# Patient Record
Sex: Female | Born: 1960 | Race: White | Hispanic: No | Marital: Married | Smoking: Never smoker
Health system: Southern US, Community
[De-identification: ages and names within clinical notes are randomized; demographics above are authoritative.]

## PROBLEM LIST (undated history)

## (undated) DIAGNOSIS — F419 Anxiety disorder, unspecified: Secondary | ICD-10-CM

## (undated) DIAGNOSIS — R011 Cardiac murmur, unspecified: Secondary | ICD-10-CM

## (undated) DIAGNOSIS — I10 Essential (primary) hypertension: Secondary | ICD-10-CM

## (undated) HISTORY — DX: Essential (primary) hypertension: I10

## (undated) HISTORY — DX: Cardiac murmur, unspecified: R01.1

## (undated) HISTORY — DX: Anxiety disorder, unspecified: F41.9

---

## 1998-05-24 HISTORY — PX: BUNIONECTOMY: SHX129

## 1999-02-10 ENCOUNTER — Other Ambulatory Visit: Admission: RE | Admit: 1999-02-10 | Discharge: 1999-02-10 | Payer: Self-pay | Admitting: Gynecology

## 1999-09-11 ENCOUNTER — Encounter: Payer: Self-pay | Admitting: Gynecology

## 1999-09-11 ENCOUNTER — Encounter: Admission: RE | Admit: 1999-09-11 | Discharge: 1999-09-11 | Payer: Self-pay | Admitting: Gynecology

## 2000-05-31 ENCOUNTER — Other Ambulatory Visit: Admission: RE | Admit: 2000-05-31 | Discharge: 2000-05-31 | Payer: Self-pay | Admitting: Gynecology

## 2000-06-20 ENCOUNTER — Encounter: Payer: Self-pay | Admitting: Gynecology

## 2000-06-20 ENCOUNTER — Encounter: Admission: RE | Admit: 2000-06-20 | Discharge: 2000-06-20 | Payer: Self-pay | Admitting: Gynecology

## 2001-06-14 ENCOUNTER — Other Ambulatory Visit: Admission: RE | Admit: 2001-06-14 | Discharge: 2001-06-14 | Payer: Self-pay | Admitting: Gynecology

## 2001-06-22 ENCOUNTER — Encounter: Payer: Self-pay | Admitting: Gynecology

## 2001-06-22 ENCOUNTER — Encounter: Admission: RE | Admit: 2001-06-22 | Discharge: 2001-06-22 | Payer: Self-pay | Admitting: Gynecology

## 2002-08-01 ENCOUNTER — Other Ambulatory Visit: Admission: RE | Admit: 2002-08-01 | Discharge: 2002-08-01 | Payer: Self-pay | Admitting: Gynecology

## 2002-10-25 ENCOUNTER — Encounter: Admission: RE | Admit: 2002-10-25 | Discharge: 2002-10-25 | Payer: Self-pay | Admitting: Gynecology

## 2002-10-25 ENCOUNTER — Encounter: Payer: Self-pay | Admitting: Gynecology

## 2003-04-12 ENCOUNTER — Encounter: Admission: RE | Admit: 2003-04-12 | Discharge: 2003-04-12 | Payer: Self-pay | Admitting: Gynecology

## 2003-09-12 ENCOUNTER — Other Ambulatory Visit: Admission: RE | Admit: 2003-09-12 | Discharge: 2003-09-12 | Payer: Self-pay | Admitting: Gynecology

## 2004-09-01 ENCOUNTER — Encounter: Admission: RE | Admit: 2004-09-01 | Discharge: 2004-09-01 | Payer: Self-pay | Admitting: Gynecology

## 2004-09-16 ENCOUNTER — Encounter: Admission: RE | Admit: 2004-09-16 | Discharge: 2004-09-16 | Payer: Self-pay | Admitting: Gynecology

## 2004-09-17 ENCOUNTER — Other Ambulatory Visit: Admission: RE | Admit: 2004-09-17 | Discharge: 2004-09-17 | Payer: Self-pay | Admitting: Gynecology

## 2005-09-03 ENCOUNTER — Encounter: Admission: RE | Admit: 2005-09-03 | Discharge: 2005-09-03 | Payer: Self-pay | Admitting: Gynecology

## 2005-09-22 ENCOUNTER — Other Ambulatory Visit: Admission: RE | Admit: 2005-09-22 | Discharge: 2005-09-22 | Payer: Self-pay | Admitting: Gynecology

## 2006-09-06 ENCOUNTER — Encounter: Admission: RE | Admit: 2006-09-06 | Discharge: 2006-09-06 | Payer: Self-pay | Admitting: Gynecology

## 2006-09-13 ENCOUNTER — Encounter: Admission: RE | Admit: 2006-09-13 | Discharge: 2006-09-13 | Payer: Self-pay | Admitting: Gynecology

## 2006-09-27 ENCOUNTER — Other Ambulatory Visit: Admission: RE | Admit: 2006-09-27 | Discharge: 2006-09-27 | Payer: Self-pay | Admitting: Gynecology

## 2007-03-14 ENCOUNTER — Encounter: Admission: RE | Admit: 2007-03-14 | Discharge: 2007-03-14 | Payer: Self-pay | Admitting: Gynecology

## 2008-03-14 ENCOUNTER — Encounter: Admission: RE | Admit: 2008-03-14 | Discharge: 2008-03-14 | Payer: Self-pay | Admitting: Gynecology

## 2008-05-30 ENCOUNTER — Encounter: Admission: RE | Admit: 2008-05-30 | Discharge: 2008-05-30 | Payer: Self-pay | Admitting: Gynecology

## 2009-03-17 ENCOUNTER — Encounter: Admission: RE | Admit: 2009-03-17 | Discharge: 2009-03-17 | Payer: Self-pay | Admitting: Gynecology

## 2009-05-24 HISTORY — PX: COLONOSCOPY: SHX174

## 2009-12-06 LAB — HM COLONOSCOPY

## 2010-03-31 ENCOUNTER — Encounter: Admission: RE | Admit: 2010-03-31 | Discharge: 2010-03-31 | Payer: Self-pay | Admitting: Gynecology

## 2011-03-10 ENCOUNTER — Other Ambulatory Visit: Payer: Self-pay | Admitting: Gynecology

## 2011-03-10 DIAGNOSIS — Z1231 Encounter for screening mammogram for malignant neoplasm of breast: Secondary | ICD-10-CM

## 2011-04-06 ENCOUNTER — Ambulatory Visit
Admission: RE | Admit: 2011-04-06 | Discharge: 2011-04-06 | Disposition: A | Discharge status: Home / Self Care | Payer: BC Managed Care – PPO | Source: Ambulatory Visit | Attending: Gynecology | Admitting: Gynecology

## 2011-04-06 DIAGNOSIS — Z1231 Encounter for screening mammogram for malignant neoplasm of breast: Secondary | ICD-10-CM

## 2011-10-14 ENCOUNTER — Ambulatory Visit (INDEPENDENT_AMBULATORY_CARE_PROVIDER_SITE_OTHER): Payer: No Typology Code available for payment source | Admitting: Family Medicine

## 2011-10-14 ENCOUNTER — Encounter: Payer: Self-pay | Admitting: Family Medicine

## 2011-10-14 DIAGNOSIS — F411 Generalized anxiety disorder: Secondary | ICD-10-CM

## 2011-10-14 DIAGNOSIS — Z0000 Encounter for general adult medical examination without abnormal findings: Secondary | ICD-10-CM | POA: Insufficient documentation

## 2011-10-14 DIAGNOSIS — I10 Essential (primary) hypertension: Secondary | ICD-10-CM

## 2011-10-14 DIAGNOSIS — F419 Anxiety disorder, unspecified: Secondary | ICD-10-CM

## 2011-10-14 LAB — HEPATIC FUNCTION PANEL
ALT: 18 U/L (ref 0–35)
AST: 23 U/L (ref 0–37)
Albumin: 4 g/dL (ref 3.5–5.2)
Bilirubin, Direct: 0.1 mg/dL (ref 0.0–0.3)
Total Bilirubin: 0.5 mg/dL (ref 0.3–1.2)
Total Protein: 7 g/dL (ref 6.0–8.3)

## 2011-10-14 LAB — BASIC METABOLIC PANEL
CO2: 27 mEq/L (ref 19–32)
Chloride: 102 mEq/L (ref 96–112)
Potassium: 3.6 mEq/L (ref 3.5–5.1)

## 2011-10-14 LAB — CBC WITH DIFFERENTIAL/PLATELET
Eosinophils Absolute: 0.1 10*3/uL (ref 0.0–0.7)
HCT: 45 % (ref 36.0–46.0)
Hemoglobin: 15 g/dL (ref 12.0–15.0)
Lymphs Abs: 2.1 10*3/uL (ref 0.7–4.0)
MCHC: 33.3 g/dL (ref 30.0–36.0)
MCV: 96.3 fl (ref 78.0–100.0)
Neutro Abs: 2.9 10*3/uL (ref 1.4–7.7)
RDW: 13.3 % (ref 11.5–14.6)
WBC: 5.6 10*3/uL (ref 4.5–10.5)

## 2011-10-14 LAB — LIPID PANEL
LDL Cholesterol: 105 mg/dL — ABNORMAL HIGH (ref 0–99)
Total CHOL/HDL Ratio: 3

## 2011-10-14 NOTE — Patient Instructions (Signed)
We'll notify you of your lab results Continue your current meds Call with any questions or concerns Keep up the good work!  You look great! Think of [place] as your home [place] Welcome!  We're glad to have you!!!

## 2011-10-14 NOTE — Progress Notes (Signed)
  Subjective:    Patient ID: Mariah Moss, female    DOB: 07/16/1960, 51 y.o.   MRN: [id number]  HPI New to establish.  No previous PCP.  GYN- Dr Nicholas Lose, last physical 8/12, thinks she had labs done.  R middle finger trigger finger- noticed 3 months ago.  HTN- chronic problem, well controlled on Maxzide.  Anxiety- chronic problem, on Celexa x3 yrs w/ good sxs control.   Review of Systems Patient reports no vision/ hearing changes, adenopathy,fever, weight change,  persistant/recurrent hoarseness , swallowing issues, chest pain, palpitations, edema, persistant/recurrent cough, hemoptysis, dyspnea (rest/exertional/paroxysmal nocturnal), gastrointestinal bleeding (melena, rectal bleeding), abdominal pain, significant heartburn, bowel changes, GU symptoms (dysuria, hematuria, incontinence), Gyn symptoms (abnormal  bleeding, pain),  syncope, focal weakness, memory loss, numbness & tingling, skin/hair/nail changes, abnormal bruising or bleeding, anxiety, or depression.     Objective:   Physical Exam General Appearance:    Alert, cooperative, no distress, appears stated age  Head:    Normocephalic, without obvious abnormality, atraumatic  Eyes:    PERRL, conjunctiva/corneas clear, EOM's intact, fundi    benign, both eyes  Ears:    Normal TM's and external ear canals, both ears  Nose:   Nares normal, septum midline, mucosa normal, no drainage    or sinus tenderness  Throat:   Lips, mucosa, and tongue normal; teeth and gums normal  Neck:   Supple, symmetrical, trachea midline, no adenopathy;    Thyroid: no enlargement/tenderness/nodules  Back:     Symmetric, no curvature, ROM normal, no CVA tenderness  Lungs:     Clear to auscultation bilaterally, respirations unlabored  Chest Wall:    No tenderness or deformity   Heart:    Regular rate and rhythm, S1 and S2 normal, no murmur, rub   or gallop  Breast Exam:    Deferred to GYN  Abdomen:     Soft, non-tender, bowel sounds active all four  quadrants,    no masses, no organomegaly  Genitalia:    Deferred to GYN  Rectal:    Extremities:   Extremities normal, atraumatic, no cyanosis or edema  Pulses:   2+ and symmetric all extremities  Skin:   Skin color, texture, turgor normal, no rashes or lesions  Lymph nodes:   Cervical, supraclavicular, and axillary nodes normal  Neurologic:   CNII-XII intact, normal strength, sensation and reflexes    throughout          Assessment & Plan:

## 2011-10-17 LAB — VITAMIN D 1,25 DIHYDROXY
Vitamin D 1, 25 (OH)2 Total: 45 pg/mL (ref 18–72)
Vitamin D2 1, 25 (OH)2: <8 pg/mL
Vitamin D3 1, 25 (OH)2: 45 pg/mL

## 2011-10-19 ENCOUNTER — Encounter: Payer: Self-pay | Admitting: Family Medicine

## 2011-10-19 NOTE — Assessment & Plan Note (Signed)
New to provider- chronic for pt.  Well controlled on current meds.  Asymptomatic.  No changes.  Will assume prescribing role.

## 2011-10-19 NOTE — Assessment & Plan Note (Signed)
Pt's PE WNL.  UTD on GYN and colonoscopy.  Check labs.  EKG done- see document for interpretation.  Anticipatory guidance provided.  

## 2011-10-19 NOTE — Assessment & Plan Note (Signed)
New to provider.  Chronic for pt.  Well controlled on current SSRI.  No changes.  Will follow.

## 2011-10-20 ENCOUNTER — Telehealth: Payer: Self-pay | Admitting: *Deleted

## 2011-10-20 NOTE — Telephone Encounter (Signed)
Patient called and left voice message requesting a return phone call regarding her lab results.

## 2011-10-21 NOTE — Telephone Encounter (Signed)
Noted pt discussed her lab results on 10-19-11 with F. Deloach, called pt to advise, left message on home number to call office if further questions

## 2011-10-29 ENCOUNTER — Encounter: Payer: Self-pay | Admitting: Family Medicine

## 2011-10-29 ENCOUNTER — Ambulatory Visit (INDEPENDENT_AMBULATORY_CARE_PROVIDER_SITE_OTHER): Payer: No Typology Code available for payment source | Admitting: Family Medicine

## 2011-10-29 ENCOUNTER — Telehealth: Payer: Self-pay | Admitting: Family Medicine

## 2011-10-29 VITALS — BP 124/78 | HR 65 | Temp 98.3°F | Ht 65.0 in | Wt 140.6 lb

## 2011-10-29 DIAGNOSIS — L237 Allergic contact dermatitis due to plants, except food: Secondary | ICD-10-CM | POA: Insufficient documentation

## 2011-10-29 DIAGNOSIS — L255 Unspecified contact dermatitis due to plants, except food: Secondary | ICD-10-CM

## 2011-10-29 LAB — BASIC METABOLIC PANEL
BUN: 20 mg/dL (ref 6–23)
Calcium: 10.6 mg/dL — ABNORMAL HIGH (ref 8.4–10.5)
Sodium: 140 mEq/L (ref 135–145)

## 2011-10-29 MED ORDER — METHYLPREDNISOLONE ACETATE 80 MG/ML IJ SUSP
80.0000 mg | Freq: Once | INTRAMUSCULAR | Status: AC
  Administered 2011-10-29: 80 mg via INTRAMUSCULAR

## 2011-10-29 NOTE — Patient Instructions (Addendum)
The depo medrol should treat your poison- if after 3 days your symptoms continue to worsen, call and we'll send the oral steroids Use hydrocortisone cream as needed We'll notify you of your lab results Call with any questions or concerns Have a great weekend!!

## 2011-10-29 NOTE — Telephone Encounter (Signed)
Treated by MD Beverely Low in office today

## 2011-10-29 NOTE — Telephone Encounter (Signed)
Will see at appt

## 2011-10-29 NOTE — Telephone Encounter (Signed)
Caller: Mariah Moss/Patient; PCP: Sheliah Hatch.; CB#: [phone]; ; ; Call regarding Poison [place] [facility];  occurs most often in the spring; in the past, has been able to get a shot of prednisone at [facility] and that works well.  Is a newer patient of Dr. Rennis Golden and would like a prednisone shot.  Onset 10/25/11 and is unresponsive to OTC medications.  Per protocol, emergent symptoms denied; advised appt within 24 hours.  Appt sched 10/29/11 1315 with Dr. Beverely Low.

## 2011-10-29 NOTE — Progress Notes (Signed)
  Subjective:    Patient ID: Mariah Moss, female    DOB: 03-06-1961, 51 y.o.   MRN: [id number]  HPI Poison ivy- pt reports she gets this every spring.  Has been using OTC meds w/out relief.  Has hx of similar, typically requiring steroid shot.  Usually does not require oral meds- 'i just get a shot'.  Has patches on all extremities and trunk.  Very itchy  Hypercalcemia- slightly elevated on recent labs.  Due for recheck.  Denies abd pain, arthralgias, fatigue.   Review of Systems For ROS see HPI     Objective:   Physical Exam  Vitals reviewed. Constitutional: She appears well-developed and well-nourished. No distress.  Skin: Skin is warm and dry. Rash (patchy, linear rash consistent w/ contact dermatitis) noted.          Assessment & Plan:

## 2011-10-31 NOTE — Assessment & Plan Note (Signed)
New.  Discovered on recent labs.  Asymptomatic.  Recheck today to trend.

## 2011-10-31 NOTE — Assessment & Plan Note (Signed)
New.  Pt w/ hx of similar.  Although it is unconventional, pt reports that she only requires shot and does not take oral steroids.  Depo-medrol given.  Pt to call if sxs don't improve, will then prescribe oral meds.  Pt expressed understanding and is in agreement w/ plan.

## 2011-11-01 ENCOUNTER — Other Ambulatory Visit: Payer: No Typology Code available for payment source

## 2011-11-04 ENCOUNTER — Encounter: Payer: Self-pay | Admitting: *Deleted

## 2012-02-07 ENCOUNTER — Other Ambulatory Visit: Payer: Self-pay | Admitting: Family Medicine

## 2012-02-07 NOTE — Telephone Encounter (Signed)
Ok for #90 of each 

## 2012-02-07 NOTE — Telephone Encounter (Signed)
Lm on triage line at 114pm please call [phone] if no answer call [phone]

## 2012-02-07 NOTE — Telephone Encounter (Signed)
needs to speak with someone regarding refills needed - LM on triage line at 919am Cb# [phone]

## 2012-02-07 NOTE — Telephone Encounter (Signed)
Left another msg for pt to call

## 2012-02-07 NOTE — Telephone Encounter (Signed)
Pt left vm on triage line stating she was returning someone's call. Call back # [phone]

## 2012-02-07 NOTE — Telephone Encounter (Signed)
Pt says Dr. Beverely Low is taking over her prescriptions that were previously prescribed by her ob/gyn.  She is requesting a 90 day supply for her citalopram 20mg  once daily and maxide 37.5/25mg  once daily.  [facility] [address]

## 2012-02-08 MED ORDER — TRIAMTERENE-HCTZ 37.5-25 MG PO TABS: 1.0000 | ORAL_TABLET | Freq: Every day | ORAL | Status: DC

## 2012-02-08 MED ORDER — CITALOPRAM HYDROBROMIDE 20 MG PO TABS: 20.0000 mg | ORAL_TABLET | Freq: Every day | ORAL | Status: DC

## 2012-02-08 NOTE — Telephone Encounter (Signed)
rx sent to pharmacy by e-script Noted waiver in pt chart signed to allow detailed messages to be left on voicemail, left detailed message about: results/instructions/prescribtion information. Advise if any further concerns or questions please call our office at [phone].

## 2012-03-03 ENCOUNTER — Other Ambulatory Visit: Payer: Self-pay | Admitting: Family Medicine

## 2012-03-03 NOTE — Telephone Encounter (Signed)
Pt advised that she did not receive a 90 day supply from the pharmacy and wanted to have a year supply for the citalopram and was upset that she did not get this to begin with, advised MD Beverely Low is not in the office at this time and I will address this with the pharmacy as well, spoke to [facility] pharmacist [facility] advising they had a glitch in their system and must have not gotten the RX, gave verbal order to give pt 90 day supply of Celexa 20mg  and Maxide 90 day supply, left vm on pt home and work number to call office to advise that I will also send a notation to MD Tabori concerning the request of a years supply, please advise new to establish with CPE noted on 5-13

## 2012-03-03 NOTE — Telephone Encounter (Signed)
Pt has questions about meds - one question she mentioned was rx that has only been given for one month and not for a year. She also has other questions, pls call pt

## 2012-03-03 NOTE — Telephone Encounter (Signed)
.  left message to have patient return my call per noted the medications were sent for 02-08-12 for a 3 month supply per MD Tabori,NOT a one month supply

## 2012-03-03 NOTE — Telephone Encounter (Signed)
Noted waiver in pt chart signed to allow detailed messages to be left on voicemail, left detailed message about: results/instructions/prescribtion information. Advise if any further concerns or questions please call our office at [phone]. To advise that [facility] day supply has been sent for celexa and maxide, that MD Beverely Low can verify if a year supply can be sent then we will call her back with MD Tabori response

## 2012-03-07 MED ORDER — TRIAMTERENE-HCTZ 37.5-25 MG PO TABS: 1.0000 | ORAL_TABLET | Freq: Every day | ORAL | Status: DC

## 2012-03-07 MED ORDER — CITALOPRAM HYDROBROMIDE 20 MG PO TABS: 20.0000 mg | ORAL_TABLET | Freq: Every day | ORAL | Status: DC

## 2012-03-07 NOTE — Telephone Encounter (Signed)
Spoke with MD Beverely Low concerning the refill for all of pt medications to be sent for a year, MD gave verbal ok to send pt a years worth of medications, sent celexa and maxide for #90 with 1 refill to cover pt for the year until cpe for 5-14, left detailed message per dpr to call office if any further assistance needed per sent refills for the remainder of the year via escribe per pt request

## 2012-03-20 ENCOUNTER — Other Ambulatory Visit: Payer: Self-pay | Admitting: Gynecology

## 2012-03-20 DIAGNOSIS — Z1231 Encounter for screening mammogram for malignant neoplasm of breast: Secondary | ICD-10-CM

## 2012-04-03 ENCOUNTER — Encounter: Payer: Self-pay | Admitting: Family Medicine

## 2012-04-10 ENCOUNTER — Ambulatory Visit
Admission: RE | Admit: 2012-04-10 | Discharge: 2012-04-10 | Disposition: A | Discharge status: Home / Self Care | Payer: No Typology Code available for payment source | Source: Ambulatory Visit | Attending: Gynecology | Admitting: Gynecology

## 2012-04-10 DIAGNOSIS — Z1231 Encounter for screening mammogram for malignant neoplasm of breast: Secondary | ICD-10-CM

## 2012-07-08 ENCOUNTER — Other Ambulatory Visit: Payer: Self-pay

## 2012-07-22 ENCOUNTER — Other Ambulatory Visit: Payer: Self-pay | Admitting: Family Medicine

## 2012-07-24 NOTE — Telephone Encounter (Signed)
Please advise on RF request.  Last CPE;10-14-11.//AB/CMA

## 2012-09-21 ENCOUNTER — Ambulatory Visit (INDEPENDENT_AMBULATORY_CARE_PROVIDER_SITE_OTHER): Payer: No Typology Code available for payment source | Admitting: Family Medicine

## 2012-09-21 ENCOUNTER — Encounter: Payer: Self-pay | Admitting: Family Medicine

## 2012-09-21 VITALS — BP 110/80 | HR 61 | Temp 98.4°F | Ht 64.25 in | Wt 145.4 lb

## 2012-09-21 DIAGNOSIS — Z0000 Encounter for general adult medical examination without abnormal findings: Secondary | ICD-10-CM

## 2012-09-21 DIAGNOSIS — Z1331 Encounter for screening for depression: Secondary | ICD-10-CM

## 2012-09-21 MED ORDER — TRIAMTERENE-HCTZ 37.5-25 MG PO TABS: ORAL_TABLET | ORAL | Status: DC

## 2012-09-21 MED ORDER — CITALOPRAM HYDROBROMIDE 20 MG PO TABS: ORAL_TABLET | ORAL | Status: DC

## 2012-09-21 NOTE — Progress Notes (Signed)
  Subjective:    Patient ID: Mariah Moss, female    DOB: 1960/10/26, 52 y.o.   MRN: [id number]  HPI CPE- UTD on GYN, colonoscopy.  No concerns.   Review of Systems Patient reports no vision/ hearing changes, adenopathy,fever, weight change,  persistant/recurrent hoarseness , swallowing issues, chest pain, palpitations, edema, persistant/recurrent cough, hemoptysis, dyspnea (rest/exertional/paroxysmal nocturnal), gastrointestinal bleeding (melena, rectal bleeding), abdominal pain, significant heartburn, bowel changes, GU symptoms (dysuria, hematuria, incontinence), Gyn symptoms (abnormal  bleeding, pain),  syncope, focal weakness, memory loss, numbness & tingling, skin/hair/nail changes, abnormal bruising or bleeding, anxiety, or depression.     Objective:   Physical Exam General Appearance:    Alert, cooperative, no distress, appears stated age  Head:    Normocephalic, without obvious abnormality, atraumatic  Eyes:    PERRL, conjunctiva/corneas clear, EOM's intact, fundi    benign, both eyes  Ears:    Normal TM's and external ear canals, both ears  Nose:   Nares normal, septum midline, mucosa normal, no drainage    or sinus tenderness  Throat:   Lips, mucosa, and tongue normal; teeth and gums normal  Neck:   Supple, symmetrical, trachea midline, no adenopathy;    Thyroid: no enlargement/tenderness/nodules  Back:     Symmetric, no curvature, ROM normal, no CVA tenderness  Lungs:     Clear to auscultation bilaterally, respirations unlabored  Chest Wall:    No tenderness or deformity   Heart:    Regular rate and rhythm, S1 and S2 normal, no murmur, rub   or gallop  Breast Exam:    Deferred to GYN  Abdomen:     Soft, non-tender, bowel sounds active all four quadrants,    no masses, no organomegaly  Genitalia:    Deferred to GYN  Rectal:    Extremities:   Extremities normal, atraumatic, no cyanosis or edema  Pulses:   2+ and symmetric all extremities  Skin:   Skin color, texture,  turgor normal, no rashes or lesions  Lymph nodes:   Cervical, supraclavicular, and axillary nodes normal  Neurologic:   CNII-XII intact, normal strength, sensation and reflexes    throughout          Assessment & Plan:

## 2012-09-21 NOTE — Patient Instructions (Addendum)
Follow up in 1 year or as needed We'll notify you of your lab results and make any changes if needed Keep up the good work!  You look great! Call with any questions or concerns Happy Spring!!! 

## 2012-09-22 LAB — CBC WITH DIFFERENTIAL/PLATELET
Basophils Absolute: 0.1 10*3/uL (ref 0.0–0.1)
Eosinophils Absolute: 0.1 10*3/uL (ref 0.0–0.7)
HCT: 43.2 % (ref 36.0–46.0)
Hemoglobin: 15.1 g/dL — ABNORMAL HIGH (ref 12.0–15.0)
Lymphs Abs: 2.3 10*3/uL (ref 0.7–4.0)
MCV: 93.3 fl (ref 78.0–100.0)
Monocytes Relative: 7.2 % (ref 3.0–12.0)
RDW: 13.7 % (ref 11.5–14.6)
WBC: 6.6 10*3/uL (ref 4.5–10.5)

## 2012-09-24 NOTE — Assessment & Plan Note (Signed)
Pt's PE WNL.  UTD on GYN, colonoscopy.  Check labs.  Anticipatory guidance provided.  

## 2012-09-25 LAB — LIPID PANEL
HDL: 62.9 mg/dL (ref >39.00)
Total CHOL/HDL Ratio: 3
VLDL: 15.6 mg/dL (ref 0.0–40.0)

## 2012-09-25 LAB — BASIC METABOLIC PANEL
CO2: 28 mEq/L (ref 19–32)
Potassium: 3.6 mEq/L (ref 3.5–5.1)

## 2012-09-25 LAB — VITAMIN D 1,25 DIHYDROXY
Vitamin D 1, 25 (OH)2 Total: 53 pg/mL (ref 18–72)
Vitamin D2 1, 25 (OH)2: <8 pg/mL
Vitamin D3 1, 25 (OH)2: 53 pg/mL

## 2012-09-25 LAB — HEPATIC FUNCTION PANEL
ALT: 27 U/L (ref 0–35)
AST: 37 U/L (ref 0–37)
Alkaline Phosphatase: 68 U/L (ref 39–117)
Total Bilirubin: 0.7 mg/dL (ref 0.3–1.2)

## 2012-12-06 LAB — HM PAP SMEAR: HM Pap smear: NORMAL

## 2013-03-29 ENCOUNTER — Other Ambulatory Visit: Payer: Self-pay

## 2013-04-25 ENCOUNTER — Other Ambulatory Visit: Payer: Self-pay

## 2013-04-25 DIAGNOSIS — Z1231 Encounter for screening mammogram for malignant neoplasm of breast: Secondary | ICD-10-CM

## 2013-05-24 LAB — HM MAMMOGRAPHY: HM Mammogram: NORMAL

## 2013-05-31 ENCOUNTER — Ambulatory Visit
Admission: RE | Admit: 2013-05-31 | Discharge: 2013-05-31 | Disposition: A | Discharge status: Home / Self Care | Payer: BC Managed Care – PPO | Source: Ambulatory Visit

## 2013-05-31 DIAGNOSIS — Z1231 Encounter for screening mammogram for malignant neoplasm of breast: Secondary | ICD-10-CM

## 2013-06-05 ENCOUNTER — Other Ambulatory Visit: Payer: Self-pay | Admitting: Gynecology

## 2013-06-05 DIAGNOSIS — R928 Other abnormal and inconclusive findings on diagnostic imaging of breast: Secondary | ICD-10-CM

## 2013-06-15 ENCOUNTER — Ambulatory Visit
Admission: RE | Admit: 2013-06-15 | Discharge: 2013-06-15 | Disposition: A | Discharge status: Home / Self Care | Payer: BC Managed Care – PPO | Source: Ambulatory Visit | Attending: Gynecology | Admitting: Gynecology

## 2013-06-15 DIAGNOSIS — R928 Other abnormal and inconclusive findings on diagnostic imaging of breast: Secondary | ICD-10-CM

## 2013-10-15 ENCOUNTER — Other Ambulatory Visit: Payer: Self-pay | Admitting: Family Medicine

## 2013-10-17 NOTE — Telephone Encounter (Signed)
Med filled and letter mailed to pt to make follow up BP and annual exam.

## 2013-12-04 ENCOUNTER — Other Ambulatory Visit: Payer: Self-pay | Admitting: Family Medicine

## 2013-12-05 NOTE — Telephone Encounter (Signed)
Med filled, pt has cpe tomorrow.

## 2013-12-05 NOTE — Telephone Encounter (Signed)
Pt is needing the refill.  Pt lost medications and is just needing these refilled.

## 2013-12-06 ENCOUNTER — Ambulatory Visit (INDEPENDENT_AMBULATORY_CARE_PROVIDER_SITE_OTHER): Payer: BC Managed Care – PPO | Admitting: Family Medicine

## 2013-12-06 ENCOUNTER — Encounter: Payer: Self-pay | Admitting: Family Medicine

## 2013-12-06 VITALS — BP 120/78 | HR 59 | Temp 98.0°F | Resp 16 | Ht 65.0 in | Wt 137.0 lb

## 2013-12-06 DIAGNOSIS — Z0000 Encounter for general adult medical examination without abnormal findings: Secondary | ICD-10-CM

## 2013-12-06 DIAGNOSIS — M461 Sacroiliitis, not elsewhere classified: Secondary | ICD-10-CM

## 2013-12-06 LAB — CBC WITH DIFFERENTIAL/PLATELET
BASOS PCT: 0.3 % (ref 0.0–3.0)
Basophils Absolute: 0 10*3/uL (ref 0.0–0.1)
EOS PCT: 0.8 % (ref 0.0–5.0)
Eosinophils Absolute: 0 10*3/uL (ref 0.0–0.7)
HEMATOCRIT: 44.6 % (ref 36.0–46.0)
HEMOGLOBIN: 15.1 g/dL — AB (ref 12.0–15.0)
LYMPHS ABS: 1.6 10*3/uL (ref 0.7–4.0)
Lymphocytes Relative: 29.2 % (ref 12.0–46.0)
MCHC: 33.8 g/dL (ref 30.0–36.0)
MCV: 95 fl (ref 78.0–100.0)
Monocytes Absolute: 0.4 10*3/uL (ref 0.1–1.0)
Monocytes Relative: 8.2 % (ref 3.0–12.0)
NEUTROS ABS: 3.3 10*3/uL (ref 1.4–7.7)
Neutrophils Relative %: 61.5 % (ref 43.0–77.0)
Platelets: 203 10*3/uL (ref 150.0–400.0)
RBC: 4.69 Mil/uL (ref 3.87–5.11)
RDW: 13.9 % (ref 11.5–15.5)
WBC: 5.4 10*3/uL (ref 4.0–10.5)

## 2013-12-06 LAB — LIPID PANEL
CHOL/HDL RATIO: 3
Cholesterol: 203 mg/dL — ABNORMAL HIGH (ref 0–200)
HDL: 71.3 mg/dL (ref >39.00)
LDL CALC: 124 mg/dL — AB (ref 0–99)
NONHDL: 131.7
TRIGLYCERIDES: 41 mg/dL (ref 0.0–149.0)
VLDL: 8.2 mg/dL (ref 0.0–40.0)

## 2013-12-06 LAB — HEPATIC FUNCTION PANEL
ALT: 20 U/L (ref 0–35)
AST: 19 U/L (ref 0–37)
Albumin: 4 g/dL (ref 3.5–5.2)
Alkaline Phosphatase: 74 U/L (ref 39–117)
BILIRUBIN TOTAL: 0.6 mg/dL (ref 0.2–1.2)
Bilirubin, Direct: 0 mg/dL (ref 0.0–0.3)
Total Protein: 7 g/dL (ref 6.0–8.3)

## 2013-12-06 LAB — BASIC METABOLIC PANEL
BUN: 22 mg/dL (ref 6–23)
CO2: 27 mEq/L (ref 19–32)
CREATININE: 1 mg/dL (ref 0.4–1.2)
Calcium: 10.9 mg/dL — ABNORMAL HIGH (ref 8.4–10.5)
Chloride: 107 mEq/L (ref 96–112)
GFR: 64.37 mL/min (ref >60.00)
Glucose, Bld: 92 mg/dL (ref 70–99)
POTASSIUM: 3.9 meq/L (ref 3.5–5.1)
Sodium: 139 mEq/L (ref 135–145)

## 2013-12-06 LAB — TSH: TSH: 1.67 u[IU]/mL (ref 0.35–4.50)

## 2013-12-06 LAB — VITAMIN D 25 HYDROXY (VIT D DEFICIENCY, FRACTURES): VITD: 30 ng/mL

## 2013-12-06 MED ORDER — TRIAMTERENE-HCTZ 37.5-25 MG PO TABS: ORAL_TABLET | ORAL | Status: DC

## 2013-12-06 MED ORDER — CYCLOBENZAPRINE HCL 10 MG PO TABS: 10.0000 mg | ORAL_TABLET | Freq: Three times a day (TID) | ORAL | Status: DC | PRN

## 2013-12-06 MED ORDER — CITALOPRAM HYDROBROMIDE 20 MG PO TABS: ORAL_TABLET | ORAL | Status: DC

## 2013-12-06 NOTE — Assessment & Plan Note (Signed)
New.  Pt able to move very fluidly around the room, no difficulty w/ stepping up on table.  + TTP over L SI joint.  Start NSAIDs prn.  Muscle relaxers prn.  Reviewed supportive care and red flags that should prompt return.  Pt expressed understanding and is in agreement w/ plan.

## 2013-12-06 NOTE — Progress Notes (Signed)
Pre visit review using our clinic review tool, if applicable. No additional management support is needed unless otherwise documented below in the visit note. 

## 2013-12-06 NOTE — Patient Instructions (Signed)
Follow up in 1 year- sooner if needed Start the flexeril as needed at night for pain and spasm (can cause drowsiness) Ibuprofen as needed for pain/inflammation Heat as needed Try to remain as active as possible We'll notify you of your lab results and make any changes if needed Call with any questions or concerns Happy [birth year][employer]!

## 2013-12-06 NOTE — Progress Notes (Signed)
   Subjective:    Patient ID: Mariah Moss, female    DOB: 1961/01/02, 53 y.o.   MRN: [id number]  HPI CPE- UTD on colonoscopy, GYN, mammo.  LBP- sxs started 6-9 months ago.  L sided.  Pain is worse w/ prolonged standing or with changing positions.  Pain is worsening rather than improving.  Is more frequent.  No weakness or numbness associated w/ pain.  Relief w/ NSAIDs.  No bowel or bladder incontinence.   Review of Systems Patient reports no vision/ hearing changes, adenopathy,fever, weight change,  persistant/recurrent hoarseness , swallowing issues, chest pain, palpitations, edema, persistant/recurrent cough, hemoptysis, dyspnea (rest/exertional/paroxysmal nocturnal), gastrointestinal bleeding (melena, rectal bleeding), abdominal pain, significant heartburn, bowel changes, GU symptoms (dysuria, hematuria, incontinence), Gyn symptoms (abnormal  bleeding, pain),  syncope, focal weakness, memory loss, numbness & tingling, skin/hair/nail changes, abnormal bruising or bleeding, anxiety, or depression.     Objective:   Physical Exam General Appearance:    Alert, cooperative, no distress, appears stated age  Head:    Normocephalic, without obvious abnormality, atraumatic  Eyes:    PERRL, conjunctiva/corneas clear, EOM's intact, fundi    benign, both eyes  Ears:    Normal TM's and external ear canals, both ears  Nose:   Nares normal, septum midline, mucosa normal, no drainage    or sinus tenderness  Throat:   Lips, mucosa, and tongue normal; teeth and gums normal  Neck:   Supple, symmetrical, trachea midline, no adenopathy;    Thyroid: no enlargement/tenderness/nodules  Back:     Symmetric, no curvature, ROM normal, no CVA tenderness  Lungs:     Clear to auscultation bilaterally, respirations unlabored  Chest Wall:    No tenderness or deformity   Heart:    Regular rate and rhythm, S1 and S2 normal, no murmur, rub   or gallop  Breast Exam:    Deferred to GYN  Abdomen:     Soft, non-tender,  bowel sounds active all four quadrants,    no masses, no organomegaly  Genitalia:    Deferred to GYN  Rectal:    Extremities:   Extremities normal, atraumatic, no cyanosis or edema  Pulses:   2+ and symmetric all extremities  Skin:   Skin color, texture, turgor normal, no rashes or lesions  Lymph nodes:   Cervical, supraclavicular, and axillary nodes normal  Neurologic:   CNII-XII intact, normal strength, sensation and reflexes    Throughout.  (-) SLR bilaterally Good flexion/extension of spine.  Mild TTP over L SI joint          Assessment & Plan:

## 2013-12-06 NOTE — Assessment & Plan Note (Signed)
Pt's PE WNL.  UTD on GYN and colonoscopy.  Check labs.  Anticipatory guidance provided.  

## 2013-12-07 ENCOUNTER — Ambulatory Visit: Payer: BC Managed Care – PPO

## 2013-12-10 ENCOUNTER — Telehealth: Payer: Self-pay | Admitting: General Practice

## 2013-12-10 NOTE — Telephone Encounter (Signed)
Lorain ChildesFYI, [place] [facility] called stating that [facility] advised them that there was not enough sample to complete PTH lab add on, please advise.

## 2013-12-10 NOTE — Telephone Encounter (Signed)
Please call pt had have her return for lab draw (PTH) due to elevated Ca

## 2013-12-11 NOTE — Telephone Encounter (Signed)
Called pt and lmovm informing pt that she needed to contact the office to schedule PTh lab draw due to not enough sample being left to test. Orders placed.

## 2013-12-19 ENCOUNTER — Encounter: Payer: Self-pay | Admitting: General Practice

## 2013-12-19 NOTE — Telephone Encounter (Signed)
Cannot reach pt, letter mailed.

## 2014-01-22 ENCOUNTER — Encounter: Payer: Self-pay | Admitting: Medical

## 2014-01-22 ENCOUNTER — Ambulatory Visit (INDEPENDENT_AMBULATORY_CARE_PROVIDER_SITE_OTHER): Payer: BC Managed Care – PPO | Admitting: Medical

## 2014-01-22 VITALS — BP 138/85 | HR 65 | Temp 98.7°F | Ht 64.0 in | Wt 140.8 lb

## 2014-01-22 DIAGNOSIS — H8110 Benign paroxysmal vertigo, unspecified ear: Secondary | ICD-10-CM | POA: Insufficient documentation

## 2014-01-22 MED ORDER — MECLIZINE HCL 12.5 MG PO TABS: 12.5000 mg | ORAL_TABLET | Freq: Three times a day (TID) | ORAL | Status: DC | PRN

## 2014-01-22 MED ORDER — CEFDINIR 300 MG PO CAPS: 300.0000 mg | ORAL_CAPSULE | Freq: Two times a day (BID) | ORAL | Status: DC

## 2014-01-22 NOTE — Patient Instructions (Signed)
I believe you most likely have benign positional vertigo/dizziness. I am prescribing meclizine and will send that to your pharmacy. There are other potential causes that could impact your sense of balance. Your blood pressure is slightly high compared to recent baseline. Therefore I would like you to check your blood pressure daily and notify me if your systolic is close to 140 or above. I also would like you to watch your diastolic blood pressure and notify us if above 90. In that event we could add an additional blood pressure medicine to your current regimen. Also I had notified you of the slight minimal redness on her left tympanic membrane. If you have any your discomfort at all I would like to start an antibiotic which I am making available today. That will be sent to your pharmacy as well. In the event of any severe worsening dizziness with any associated neurologic signs or symptoms as than I would advise emergency department evaluation immediately. If slight dizziness is persisting despite above measures then could consider physical therapy evaluation. Followup in 7 days or as needed. Vertigo Vertigo means you feel like you or your surroundings are moving when they are not. Vertigo can be dangerous if it occurs when you are at work, driving, or performing difficult activities.  CAUSES  Vertigo occurs when there is a conflict of signals sent to your brain from the visual and sensory systems in your body. There are many different causes of vertigo, including:  Infections, especially in the inner ear.  A bad reaction to a drug or misuse of alcohol and medicines.  Withdrawal from drugs or alcohol.  Rapidly changing positions, such as lying down or rolling over in bed.  A migraine headache.  Decreased blood flow to the brain.  Increased pressure in the brain from a head injury, infection, tumor, or bleeding. SYMPTOMS  You may feel as though the world is spinning around or you are falling to  the ground. Because your balance is upset, vertigo can cause nausea and vomiting. You may have involuntary eye movements (nystagmus). DIAGNOSIS  Vertigo is usually diagnosed by physical exam. If the cause of your vertigo is unknown, your caregiver may perform imaging tests, such as an MRI scan (magnetic resonance imaging). TREATMENT  Most cases of vertigo resolve on their own, without treatment. Depending on the cause, your caregiver may prescribe certain medicines. If your vertigo is related to body position issues, your caregiver may recommend movements or procedures to correct the problem. In rare cases, if your vertigo is caused by certain inner ear problems, you may need surgery. HOME CARE INSTRUCTIONS   Follow your caregiver's instructions.  Avoid driving.  Avoid operating heavy machinery.  Avoid performing any tasks that would be dangerous to you or others during a vertigo episode.  Tell your caregiver if you notice that certain medicines seem to be causing your vertigo. Some of the medicines used to treat vertigo episodes can actually make them worse in some people. SEEK IMMEDIATE MEDICAL CARE IF:   Your medicines do not relieve your vertigo or are making it worse.  You develop problems with talking, walking, weakness, or using your arms, hands, or legs.  You develop severe headaches.  Your nausea or vomiting continues or gets worse.  You develop visual changes.  A family member notices behavioral changes.  Your condition gets worse. MAKE SURE YOU:  Understand these instructions.  Will watch your condition.  Will get help right away if you are not doing  well or get worse. Document Released: 02/17/2005 Document Revised: 08/02/2011 Document Reviewed: 11/26/2010 [facility] Patient Information 2015 [place], [place]. This information is not intended to replace advice given to you by your health care provider. Make sure you discuss any questions you have with your health care  provider.

## 2014-01-22 NOTE — Progress Notes (Signed)
   Subjective:    Patient ID: Mariah Moss, female    DOB: January 31, 1961, 53 y.o.   MRN: [id number]  HPI  Pt in felt dizzy since yesterday morning. She states woke up and room was spinning. Faint nausea yesterday that lasted 2-3 hours then resolved. Dizziness also lasted 2-3 hours. By time she got to office felt better. Later in day turning head and bending over would cause some dizziness. Then last night she woke up randomly and had dizziness sensation but no definite room spinning. She still feels slight dizzy and unsteady. Her bp this 132/92. She states usually recently her bp has been 120/70-80 range. No recent head trauma. No ha, no no vomiting, no gross motor or sensory function deficits. No congestion. No excess alcohol. Recent hike but no ticks found. No history of vertigo and no hx of dizziness when diagnosed with htn. She has been on diuretic for years.    Review of Systems  Constitutional: Negative for fever, chills and fatigue.  HENT: Negative for congestion, ear discharge, ear pain, facial swelling, mouth sores, nosebleeds, postnasal drip, rhinorrhea, trouble swallowing and voice change.   Eyes: Negative for photophobia and visual disturbance.  Respiratory: Negative for cough, chest tightness and wheezing.   Cardiovascular: Negative for chest pain and palpitations.  Gastrointestinal: Negative.   Endocrine: Negative for polydipsia, polyphagia and polyuria.  Musculoskeletal: Negative for arthralgias, back pain, myalgias, neck pain and neck stiffness.  Neurological: Positive for dizziness. Negative for tremors, seizures, syncope, facial asymmetry, speech difficulty, weakness, numbness and headaches.  Hematological: Negative for adenopathy. Does not bruise/bleed easily.  Psychiatric/Behavioral: Negative.        Objective:   Physical Exam  General Mental Status- Alert. General Appearance- Not in acute distress.   Skin General: Color- Normal Color. Moisture- Normal  Moisture.  Neck Carotid Arteries- Normal color. Moisture- Normal Moisture. No carotid bruits. No JVD.  Chest and Lung Exam Auscultation: Breath Sounds:-Normal.  Cardiovascular Auscultation:Rythm- Regular. Murmurs & Other Heart Sounds:Auscultation of the heart reveals- No Murmurs.  HENT Normal but lt tm mild central redness. Covers about 10-15% of tm. Rt side normal.   Neurologic Cranial Nerve exam:- CN III-XII intact(No nystagmus), symmetric smile. Romberg Exam:- Negative.  Heal to Toe Gait exam:-Normal. Finger to Nose:- Normal/Intact Strength:- 5/5 equal and symmetric strength both upper and lower extremities.       Assessment & Plan:

## 2014-01-22 NOTE — Assessment & Plan Note (Signed)
Meclizine rx. For full description of plan see avs. Printed and given to patient.

## 2014-05-01 ENCOUNTER — Ambulatory Visit: Payer: BC Managed Care – PPO | Admitting: Medical

## 2014-05-01 ENCOUNTER — Ambulatory Visit (INDEPENDENT_AMBULATORY_CARE_PROVIDER_SITE_OTHER): Payer: BC Managed Care – PPO | Admitting: Medical

## 2014-05-01 ENCOUNTER — Encounter: Payer: Self-pay | Admitting: Medical

## 2014-05-01 ENCOUNTER — Ambulatory Visit (HOSPITAL_BASED_OUTPATIENT_CLINIC_OR_DEPARTMENT_OTHER)
Admission: RE | Admit: 2014-05-01 | Discharge: 2014-05-01 | Disposition: A | Discharge status: Home / Self Care | Payer: BC Managed Care – PPO | Source: Ambulatory Visit | Attending: Medical | Admitting: Medical

## 2014-05-01 VITALS — BP 130/86 | HR 64 | Temp 98.0°F | Ht 64.0 in | Wt 150.8 lb

## 2014-05-01 DIAGNOSIS — M461 Sacroiliitis, not elsewhere classified: Secondary | ICD-10-CM

## 2014-05-01 DIAGNOSIS — M5442 Lumbago with sciatica, left side: Secondary | ICD-10-CM | POA: Diagnosis not present

## 2014-05-01 MED ORDER — DICLOFENAC SODIUM 75 MG PO TBEC: 75.0000 mg | DELAYED_RELEASE_TABLET | Freq: Two times a day (BID) | ORAL | Status: DC

## 2014-05-01 MED ORDER — TIZANIDINE HCL 4 MG PO TABS: 4.0000 mg | ORAL_TABLET | Freq: Four times a day (QID) | ORAL | Status: DC | PRN

## 2014-05-01 NOTE — Patient Instructions (Addendum)
For your apparent sciatica, I am prescribing diclofenac nsaid and zanaflex muscle relaxant. Since your symptoms have been for 6-9 months I am going to refer you to PT and going to go ahead and get lumbar spine xray.    Stop otc nsaids.  Follow up in 2 wks or as needed.    Sciatica Sciatica is pain, weakness, numbness, or tingling along the path of the sciatic nerve. The nerve starts in the lower back and runs down the back of each leg. The nerve controls the muscles in the lower leg and in the back of the knee, while also providing sensation to the back of the thigh, lower leg, and the sole of your foot. Sciatica is a symptom of another medical condition. For instance, nerve damage or certain conditions, such as a herniated disk or bone spur on the spine, pinch or put pressure on the sciatic nerve. This causes the pain, weakness, or other sensations normally associated with sciatica. Generally, sciatica only affects one side of the body. CAUSES   Herniated or slipped disc.  Degenerative disk disease.  A pain disorder involving the narrow muscle in the buttocks (piriformis syndrome).  Pelvic injury or fracture.  Pregnancy.  Tumor (rare). SYMPTOMS  Symptoms can vary from mild to very severe. The symptoms usually travel from the low back to the buttocks and down the back of the leg. Symptoms can include:  Mild tingling or dull aches in the lower back, leg, or hip.  Numbness in the back of the calf or sole of the foot.  Burning sensations in the lower back, leg, or hip.  Sharp pains in the lower back, leg, or hip.  Leg weakness.  Severe back pain inhibiting movement. These symptoms may get worse with coughing, sneezing, laughing, or prolonged sitting or standing. Also, being overweight may worsen symptoms. DIAGNOSIS  Your caregiver will perform a physical exam to look for common symptoms of sciatica. He or she may ask you to do certain movements or activities that would trigger  sciatic nerve pain. Other tests may be performed to find the cause of the sciatica. These may include:  Blood tests.  X-rays.  Imaging tests, such as an MRI or CT scan. TREATMENT  Treatment is directed at the cause of the sciatic pain. Sometimes, treatment is not necessary and the pain and discomfort goes away on its own. If treatment is needed, your caregiver may suggest:  Over-the-counter medicines to relieve pain.  Prescription medicines, such as anti-inflammatory medicine, muscle relaxants, or narcotics.  Applying heat or ice to the painful area.  Steroid injections to lessen pain, irritation, and inflammation around the nerve.  Reducing activity during periods of pain.  Exercising and stretching to strengthen your abdomen and improve flexibility of your spine. Your caregiver may suggest losing weight if the extra weight makes the back pain worse.  Physical therapy.  Surgery to eliminate what is pressing or pinching the nerve, such as a bone spur or part of a herniated disk. HOME CARE INSTRUCTIONS   Only take over-the-counter or prescription medicines for pain or discomfort as directed by your caregiver.  Apply ice to the affected area for 20 minutes, 3-4 times a day for the first 48-72 hours. Then try heat in the same way.  Exercise, stretch, or perform your usual activities if these do not aggravate your pain.  Attend physical therapy sessions as directed by your caregiver.  Keep all follow-up appointments as directed by your caregiver.  Do not wear  high heels or shoes that do not provide proper support.  Check your mattress to see if it is too soft. A firm mattress may lessen your pain and discomfort. SEEK IMMEDIATE MEDICAL CARE IF:   You lose control of your bowel or bladder (incontinence).  You have increasing weakness in the lower back, pelvis, buttocks, or legs.  You have redness or swelling of your back.  You have a burning sensation when you  urinate.  You have pain that gets worse when you lie down or awakens you at night.  Your pain is worse than you have experienced in the past.  Your pain is lasting longer than 4 weeks.  You are suddenly losing weight without reason. MAKE SURE YOU:  Understand these instructions.  Will watch your condition.  Will get help right away if you are not doing well or get worse. Document Released: 05/04/2001 Document Revised: 11/09/2011 Document Reviewed: 09/19/2011 [facility] Patient Information 2015 [place], [place]. This information is not intended to replace advice given to you by your health care provider. Make sure you discuss any questions you have with your health care provider.

## 2014-05-01 NOTE — Progress Notes (Signed)
Pre visit review using our clinic review tool, if applicable. No additional management support is needed unless otherwise documented below in the visit note. 

## 2014-05-01 NOTE — Progress Notes (Signed)
Subjective:    Patient ID: Mariah Moss, female    DOB: [birth year], 53 y.o.   MRN: [id number]  HPI   Pt in with low back pain. Seemed to start 9 months ago. Mild intermittent pain at that time given flexeril (by Dr. Beverely Lowabori per pt)but made her very drowsy. She points to lt si area. Pt states now pain is getting more frequent and more intense. About 3-5 days a week. About 6 months ago pain got worse. No pain is moderate-severe level 7-8. Pt is taking alleve or advil otc. Pt thinks gained wait may have effected this.   No saddle anesthesia, no foot drop or incontinence.  Occasionally will get pain in her lt buttox area but not into her leg.   Pt mentioned that her dad had tumor of spine so this worries patient.  Past Medical History  Diagnosis Date  . Heart murmur     History   Social History  . Marital Status: Married    Spouse Name: N/A    Number of Children: N/A  . Years of Education: N/A   Occupational History  . Not on file.   Social History Main Topics  . Smoking status: Never Smoker   . Smokeless tobacco: Not on file  . Alcohol Use: Yes     Comment: occasionally  . Drug Use: No  . Sexual Activity: Not on file   Other Topics Concern  . Not on file   Social History Narrative    Past Surgical History  Procedure Laterality Date  . Bunionectomy  2000    Family History  Problem Relation Age of Onset  . Heart disease Mother   . Stroke Mother   . Hypertension Mother   . Cancer Father   . Diabetes Father     No Known Allergies  Current Outpatient Prescriptions on File Prior to Visit  Medication Sig Dispense Refill  . citalopram (CELEXA) 20 MG tablet TAKE 1 TABLET BY MOUTH EVERY DAY 90 tablet 3  . triamterene-hydrochlorothiazide (MAXZIDE-25) 37.5-25 MG per tablet TAKE 1 TABLET BY MOUTH EVERY DAY 90 tablet 3   No current facility-administered medications on file prior to visit.    BP 130/86 mmHg  Pulse 64  Temp(Src) 98 F (36.7 C) (Oral)  Ht 5\' 4"   (1.626 m)  Wt 150 lb 12.8 oz (68.402 kg)  BMI 25.87 kg/m2  SpO2 99%  LMP 12/14/2011     Review of Systems  Constitutional: Negative for fever, chills and fatigue.  Respiratory: Negative for cough, chest tightness and wheezing.   Cardiovascular: Negative for chest pain and palpitations.  Gastrointestinal: Negative for nausea, vomiting and abdominal pain.  Genitourinary: Negative for dysuria, urgency, hematuria and flank pain.  Musculoskeletal: Positive for back pain.  Neurological: Negative for weakness and numbness.       Occasional radiating pain to her lt buttox.       Objective:   Physical Exam   General Appearance- Not in acute distress.    Chest and Lung Exam Auscultation: Breath sounds:-Normal. Clear even and unlabored. Adventitious sounds:- No Adventitious sounds.  Cardiovascular Auscultation:Rythm - Regular, rate and rythm. Heart Sounds -Normal heart sounds.  Abdomen Inspection:-Inspection Normal.  Palpation/Perucssion: Palpation and Percussion of the abdomen reveal- Non Tender, No Rebound tenderness, No rigidity(Guarding) and No Palpable abdominal masses.  Liver:-Normal.  Spleen:- Normal.   Back  No Mid lumbar spine tenderness to palpation. But pain directly in lt side area. Pain on straight leg lift. Lt side raises cause  pain. Rt side do not cause pain.   Lower ext neurologic  L5-S1 sensation intact bilaterally. Normal patellar reflexes bilaterally. No foot drop bilaterally.  Lt hip- no pain on palpation or range of motion.       Assessment & Plan:

## 2014-05-01 NOTE — Assessment & Plan Note (Signed)
For your apparent sciatica, I am prescribing diclofenac nsaid and zanaflex muscle relaxant. Since your symptoms have been for 6-9 months I am going to refer you to PT and going to go ahead and get lumbar spine xray.    Stop otc nsaids.  Follow up in 2 wks or as needed.

## 2014-05-14 ENCOUNTER — Encounter: Payer: Self-pay | Admitting: Physician Assistant

## 2014-05-14 ENCOUNTER — Ambulatory Visit (INDEPENDENT_AMBULATORY_CARE_PROVIDER_SITE_OTHER): Payer: BC Managed Care – PPO | Admitting: Physician Assistant

## 2014-05-14 VITALS — BP 126/77 | HR 71 | Temp 98.4°F | Resp 16 | Ht 64.0 in | Wt 148.4 lb

## 2014-05-14 DIAGNOSIS — B9689 Other specified bacterial agents as the cause of diseases classified elsewhere: Secondary | ICD-10-CM | POA: Insufficient documentation

## 2014-05-14 DIAGNOSIS — J019 Acute sinusitis, unspecified: Secondary | ICD-10-CM | POA: Diagnosis not present

## 2014-05-14 MED ORDER — AMOXICILLIN-POT CLAVULANATE 875-125 MG PO TABS: 1.0000 | ORAL_TABLET | Freq: Two times a day (BID) | ORAL | Status: DC

## 2014-05-14 MED ORDER — HYDROCOD POLST-CHLORPHEN POLST 10-8 MG/5ML PO LQCR: 5.0000 mL | Freq: Two times a day (BID) | ORAL | Status: DC | PRN

## 2014-05-14 NOTE — Patient Instructions (Signed)
Please take antibiotic as directed.  Increase fluid intake.  Use Saline nasal spray.  Take a daily multivitamin. Use Tussionex as directed.  Place a humidifier in the bedroom.  Please call or return clinic if symptoms are not improving.  Sinusitis Sinusitis is redness, soreness, and swelling (inflammation) of the paranasal sinuses. Paranasal sinuses are air pockets within the bones of your face (beneath the eyes, the middle of the forehead, or above the eyes). In healthy paranasal sinuses, mucus is able to drain out, and air is able to circulate through them by way of your nose. However, when your paranasal sinuses are inflamed, mucus and air can become trapped. This can allow bacteria and other germs to grow and cause infection. Sinusitis can develop quickly and last only a short time (acute) or continue over a long period (chronic). Sinusitis that lasts for more than 12 weeks is considered chronic.  CAUSES  Causes of sinusitis include:  Allergies.  Structural abnormalities, such as displacement of the cartilage that separates your nostrils (deviated septum), which can decrease the air flow through your nose and sinuses and affect sinus drainage.  Functional abnormalities, such as when the small hairs (cilia) that line your sinuses and help remove mucus do not work properly or are not present. SYMPTOMS  Symptoms of acute and chronic sinusitis are the same. The primary symptoms are pain and pressure around the affected sinuses. Other symptoms include:  Upper toothache.  Earache.  Headache.  Bad breath.  Decreased sense of smell and taste.  A cough, which worsens when you are lying flat.  Fatigue.  Fever.  Thick drainage from your nose, which often is green and may contain pus (purulent).  Swelling and warmth over the affected sinuses. DIAGNOSIS  Your caregiver will perform a physical exam. During the exam, your caregiver may:  Look in your nose for signs of abnormal growths in  your nostrils (nasal polyps).  Tap over the affected sinus to check for signs of infection.  View the inside of your sinuses (endoscopy) with a special imaging device with a light attached (endoscope), which is inserted into your sinuses. If your caregiver suspects that you have chronic sinusitis, one or more of the following tests may be recommended:  Allergy tests.  Nasal culture A sample of mucus is taken from your nose and sent to a lab and screened for bacteria.  Nasal cytology A sample of mucus is taken from your nose and examined by your caregiver to determine if your sinusitis is related to an allergy. TREATMENT  Most cases of acute sinusitis are related to a viral infection and will resolve on their own within 10 days. Sometimes medicines are prescribed to help relieve symptoms (pain medicine, decongestants, nasal steroid sprays, or saline sprays).  However, for sinusitis related to a bacterial infection, your caregiver will prescribe antibiotic medicines. These are medicines that will help kill the bacteria causing the infection.  Rarely, sinusitis is caused by a fungal infection. In theses cases, your caregiver will prescribe antifungal medicine. For some cases of chronic sinusitis, surgery is needed. Generally, these are cases in which sinusitis recurs more than 3 times per year, despite other treatments. HOME CARE INSTRUCTIONS   Drink plenty of water. Water helps thin the mucus so your sinuses can drain more easily.  Use a humidifier.  Inhale steam 3 to 4 times a day (for example, sit in the bathroom with the shower running).  Apply a warm, moist washcloth to your face 3 to   4 times a day, or as directed by your caregiver.  Use saline nasal sprays to help moisten and clean your sinuses.  Take over-the-counter or prescription medicines for pain, discomfort, or fever only as directed by your caregiver. SEEK IMMEDIATE MEDICAL CARE IF:  You have increasing pain or severe  headaches.  You have nausea, vomiting, or drowsiness.  You have swelling around your face.  You have vision problems.  You have a stiff neck.  You have difficulty breathing. MAKE SURE YOU:   Understand these instructions.  Will watch your condition.  Will get help right away if you are not doing well or get worse. Document Released: 05/10/2005 Document Revised: 08/02/2011 Document Reviewed: 05/25/2011 [employer] Patient Information 2014 [employer].   

## 2014-05-14 NOTE — Progress Notes (Signed)
    Patient presents to clinic today c/o sinus pressure, sinus pain, ear pressure and PND for > 1 week.  Over the past few days has noted a worsening dry cough and sinus pain over the past few days.  Denies fever, tooth pain.  Denies shortness of breath, pleuritic chest pain.  Denies history of asthma.  Past Medical History  Diagnosis Date  . Heart murmur     Current Outpatient Prescriptions on File Prior to Visit  Medication Sig Dispense Refill  . citalopram (CELEXA) 20 MG tablet TAKE 1 TABLET BY MOUTH EVERY DAY 90 tablet 3  . diclofenac (VOLTAREN) 75 MG EC tablet Take 1 tablet (75 mg total) by mouth 2 (two) times daily. 30 tablet 0  . tiZANidine (ZANAFLEX) 4 MG tablet Take 1 tablet (4 mg total) by mouth every 6 (six) hours as needed for muscle spasms. 30 tablet 0  . triamterene-hydrochlorothiazide (MAXZIDE-25) 37.5-25 MG per tablet TAKE 1 TABLET BY MOUTH EVERY DAY 90 tablet 3   No current facility-administered medications on file prior to visit.    No Known Allergies  Family History  Problem Relation Age of Onset  . Heart disease Mother   . Stroke Mother   . Hypertension Mother   . Cancer Father   . Diabetes Father     History   Social History  . Marital Status: Married    Spouse Name: N/A    Number of Children: N/A  . Years of Education: N/A   Social History Main Topics  . Smoking status: Never Smoker   . Smokeless tobacco: None  . Alcohol Use: Yes     Comment: occasionally  . Drug Use: No  . Sexual Activity: None   Other Topics Concern  . None   Social History Narrative   Review of Systems - See HPI.  All other ROS are negative.  BP 126/77 mmHg  Pulse 71  Temp(Src) 98.4 F (36.9 C) (Oral)  Resp 16  Ht 5\' 4"  (1.626 m)  Wt 148 lb 6 oz (67.302 kg)  BMI 25.46 kg/m2  SpO2 99%  LMP 12/14/2011  Physical Exam  Constitutional: She is oriented to person, place, and time and well-developed, well-nourished, and in no distress.  HENT:  Head: Normocephalic and  atraumatic.  Right Ear: Tympanic membrane, external ear and ear canal normal.  Left Ear: Tympanic membrane, external ear and ear canal normal.  Nose: Right sinus exhibits maxillary sinus tenderness. Left sinus exhibits maxillary sinus tenderness.  Mouth/Throat: Uvula is midline, oropharynx is clear and moist and mucous membranes are normal.  Eyes: Conjunctivae and EOM are normal. Pupils are equal, round, and reactive to light.  Neck: Neck supple.  Cardiovascular: Normal rate, regular rhythm, normal heart sounds and intact distal pulses.   Pulmonary/Chest: Effort normal and breath sounds normal. No respiratory distress. She has no wheezes. She has no rales. She exhibits no tenderness.  Lymphadenopathy:    She has no cervical adenopathy.  Neurological: She is alert and oriented to person, place, and time.  Skin: Skin is warm and dry. No rash noted.  Psychiatric: Affect normal.   Assessment/Plan: Acute bacterial sinusitis Rx Augmentin.  Take as directed.  Increase fluids.  Rest.  Saline nasal spray. Plain Mucinex.  Rx tussionex for cough.  Im Depo Medrol given by nursing staff.  Return precautions discussed with patient.

## 2014-05-14 NOTE — Progress Notes (Signed)
Pre visit review using our clinic review tool, if applicable. No additional management support is needed unless otherwise documented below in the visit note/SLS  

## 2014-05-14 NOTE — Assessment & Plan Note (Signed)
Rx Augmentin.  Take as directed.  Increase fluids.  Rest.  Saline nasal spray. Plain Mucinex.  Rx tussionex for cough.  Im Depo Medrol given by nursing staff.  Return precautions discussed with patient.

## 2014-05-15 MED ORDER — METHYLPREDNISOLONE ACETATE 40 MG/ML IJ SUSP
40.0000 mg | Freq: Once | INTRAMUSCULAR | Status: AC
  Administered 2014-05-14: 40 mg via INTRAMUSCULAR

## 2014-05-15 NOTE — Addendum Note (Signed)
Addended by: Regis BillSCATES, SHARON L on: 05/15/2014 02:10 PM   Modules accepted: Orders

## 2014-05-27 ENCOUNTER — Other Ambulatory Visit: Payer: Self-pay

## 2014-05-27 MED ORDER — DICLOFENAC SODIUM 75 MG PO TBEC: 75.0000 mg | DELAYED_RELEASE_TABLET | Freq: Two times a day (BID) | ORAL | Status: DC

## 2014-05-27 NOTE — Telephone Encounter (Signed)
Thought about her request further will give her diclofenac limited rx if has recurrent back pain as before. But brief rx until she can get back in within 7-10 days.

## 2014-05-30 ENCOUNTER — Ambulatory Visit (INDEPENDENT_AMBULATORY_CARE_PROVIDER_SITE_OTHER): Payer: BC Managed Care – PPO | Admitting: Medical

## 2014-05-30 ENCOUNTER — Encounter: Payer: Self-pay | Admitting: Medical

## 2014-05-30 VITALS — BP 106/73 | HR 73 | Temp 98.4°F | Ht 64.0 in | Wt 148.0 lb

## 2014-05-30 DIAGNOSIS — M461 Sacroiliitis, not elsewhere classified: Secondary | ICD-10-CM

## 2014-05-30 MED ORDER — DICLOFENAC SODIUM 75 MG PO TBEC: 75.0000 mg | DELAYED_RELEASE_TABLET | Freq: Two times a day (BID) | ORAL | Status: DC

## 2014-05-30 NOTE — Patient Instructions (Addendum)
You appear a lot better and may be intermittent sciatica. I can provide another refill to use diclofenac intermittently. If you get another episode of consistent, consecutive pain daily basis then would refer to PT.  Follow up as needed(Definitley for consecutive daily pain episodes.)

## 2014-05-30 NOTE — Progress Notes (Signed)
   Subjective:    Patient ID: Mariah Moss, female    DOB: [birth year], 54 y.o.   MRN: [id number]  HPI   Pt states that her back did feel better with diclofenac. About 2-3 days ago pain came back and she wanted refills. But pain went away about a day ago and she is in for follow up and feels like may not need any meds now. Pt thinks sitting long periods sitting  working on computer causes pain. Level one pain at best today if any at all.  I did refer pt to physical therapy but it was too expensive.  Past Medical History  Diagnosis Date  . Heart murmur     History   Social History  . Marital Status: Married    Spouse Name: N/A    Number of Children: N/A  . Years of Education: N/A   Occupational History  . Not on file.   Social History Main Topics  . Smoking status: Never Smoker   . Smokeless tobacco: Not on file  . Alcohol Use: Yes     Comment: occasionally  . Drug Use: No  . Sexual Activity: Not on file   Other Topics Concern  . Not on file   Social History Narrative    Past Surgical History  Procedure Laterality Date  . Bunionectomy  2000    Family History  Problem Relation Age of Onset  . Heart disease Mother   . Stroke Mother   . Hypertension Mother   . Cancer Father   . Diabetes Father     No Known Allergies  Current Outpatient Prescriptions on File Prior to Visit  Medication Sig Dispense Refill  . chlorpheniramine-HYDROcodone (TUSSIONEX PENNKINETIC ER) 10-8 MG/5ML LQCR Take 5 mLs by mouth every 12 (twelve) hours as needed. 115 mL 0  . citalopram (CELEXA) 20 MG tablet TAKE 1 TABLET BY MOUTH EVERY DAY 90 tablet 3  . triamterene-hydrochlorothiazide (MAXZIDE-25) 37.5-25 MG per tablet TAKE 1 TABLET BY MOUTH EVERY DAY 90 tablet 3   No current facility-administered medications on file prior to visit.    BP 106/73 mmHg  Pulse 73  Temp(Src) 98.4 F (36.9 C) (Oral)  Ht 5\' 4"  (1.626 m)  Wt 148 lb (67.132 kg)  BMI 25.39 kg/m2  SpO2 95%  LMP  12/14/2011         Review of Systems  Constitutional: Negative for fever, chills and fatigue.  Respiratory: Negative for cough, chest tightness and wheezing.   Cardiovascular: Negative for chest pain and palpitations.  Gastrointestinal: Negative for nausea, vomiting and abdominal pain.  Genitourinary: Negative for dysuria, urgency, hematuria and flank pain.  Musculoskeletal: Positive for back pain.       Faint minimal today at best.  Neurological: Negative for weakness and numbness.       Objective:   Physical Exam   General- nad. Lungs- cta Heart- RRR Lungs- cta Abdomen- soft, nt, nd, +BS, no rebound or guarding. Back- No midline lumbar pain. Faint lt si area pain. No pain on straight leg lift. No cva pain. Lower ext- l5-s1 sensation intact bilaterally. No foot drop.         Assessment & Plan:

## 2014-05-30 NOTE — Progress Notes (Signed)
Pre visit review using our clinic review tool, if applicable. No additional management support is needed unless otherwise documented below in the visit note. 

## 2014-05-30 NOTE — Assessment & Plan Note (Signed)
You appear a lot better and may be intermittent sciatica. I can provide another refill to use diclofenac intermittently. If you get another episode of consistent, consecutive pain daily basis then would refer to PT.  Follow up as needed(Definitley for consecutive daily pain episodes.) 

## 2014-11-18 ENCOUNTER — Telehealth: Payer: Self-pay | Admitting: Family Medicine

## 2014-11-18 NOTE — Telephone Encounter (Signed)
Pre visit letter mailed 11/18/14  °

## 2014-12-06 ENCOUNTER — Telehealth: Payer: Self-pay | Admitting: Behavioral Health

## 2014-12-06 NOTE — Telephone Encounter (Signed)
Unable to reach patient at time of Pre-Visit Call.  Left message for patient to return call when available.    

## 2014-12-09 ENCOUNTER — Ambulatory Visit (INDEPENDENT_AMBULATORY_CARE_PROVIDER_SITE_OTHER): Payer: BLUE CROSS/BLUE SHIELD | Admitting: Family Medicine

## 2014-12-09 ENCOUNTER — Other Ambulatory Visit: Payer: Self-pay | Admitting: General Practice

## 2014-12-09 ENCOUNTER — Telehealth: Payer: Self-pay | Admitting: Family Medicine

## 2014-12-09 ENCOUNTER — Encounter: Payer: Self-pay | Admitting: General Practice

## 2014-12-09 ENCOUNTER — Encounter: Payer: Self-pay | Admitting: Family Medicine

## 2014-12-09 VITALS — BP 110/78 | HR 66 | Temp 98.3°F | Resp 16 | Ht 64.0 in | Wt 130.1 lb

## 2014-12-09 DIAGNOSIS — Z23 Encounter for immunization: Secondary | ICD-10-CM

## 2014-12-09 DIAGNOSIS — Z0000 Encounter for general adult medical examination without abnormal findings: Secondary | ICD-10-CM | POA: Diagnosis not present

## 2014-12-09 LAB — CBC WITH DIFFERENTIAL/PLATELET
Basophils Absolute: 0 10*3/uL (ref 0.0–0.1)
Basophils Relative: 0.7 % (ref 0.0–3.0)
Eosinophils Absolute: 0.1 10*3/uL (ref 0.0–0.7)
Eosinophils Relative: 2 % (ref 0.0–5.0)
HEMATOCRIT: 42.2 % (ref 36.0–46.0)
Hemoglobin: 14.5 g/dL (ref 12.0–15.0)
Lymphocytes Relative: 40.6 % (ref 12.0–46.0)
Lymphs Abs: 1.7 10*3/uL (ref 0.7–4.0)
MCHC: 34.3 g/dL (ref 30.0–36.0)
MCV: 94.9 fl (ref 78.0–100.0)
MONO ABS: 0.3 10*3/uL (ref 0.1–1.0)
Monocytes Relative: 8.5 % (ref 3.0–12.0)
Neutro Abs: 2 10*3/uL (ref 1.4–7.7)
Neutrophils Relative %: 48.2 % (ref 43.0–77.0)
Platelets: 192 10*3/uL (ref 150.0–400.0)
RBC: 4.45 Mil/uL (ref 3.87–5.11)
RDW: 13.8 % (ref 11.5–15.5)
WBC: 4.1 10*3/uL (ref 4.0–10.5)

## 2014-12-09 LAB — HEPATIC FUNCTION PANEL
ALT: 20 U/L (ref 0–35)
AST: 23 U/L (ref 0–37)
Albumin: 3.9 g/dL (ref 3.5–5.2)
Alkaline Phosphatase: 79 U/L (ref 39–117)
Bilirubin, Direct: 0.1 mg/dL (ref 0.0–0.3)
Total Bilirubin: 0.6 mg/dL (ref 0.2–1.2)
Total Protein: 6.8 g/dL (ref 6.0–8.3)

## 2014-12-09 LAB — LIPID PANEL
Cholesterol: 183 mg/dL (ref 0–200)
HDL: 66.7 mg/dL (ref >39.00)
LDL Cholesterol: 103 mg/dL — ABNORMAL HIGH (ref 0–99)
NonHDL: 116.3
TRIGLYCERIDES: 68 mg/dL (ref 0.0–149.0)
Total CHOL/HDL Ratio: 3
VLDL: 13.6 mg/dL (ref 0.0–40.0)

## 2014-12-09 LAB — BASIC METABOLIC PANEL
BUN: 21 mg/dL (ref 6–23)
CO2: 26 mEq/L (ref 19–32)
Calcium: 10.8 mg/dL — ABNORMAL HIGH (ref 8.4–10.5)
Chloride: 109 mEq/L (ref 96–112)
Creatinine, Ser: 0.91 mg/dL (ref 0.40–1.20)
GFR: 68.21 mL/min (ref >60.00)
Glucose, Bld: 88 mg/dL (ref 70–99)
POTASSIUM: 4.3 meq/L (ref 3.5–5.1)
SODIUM: 142 meq/L (ref 135–145)

## 2014-12-09 LAB — VITAMIN D 25 HYDROXY (VIT D DEFICIENCY, FRACTURES): VITD: 24.22 ng/mL — ABNORMAL LOW (ref 30.00–100.00)

## 2014-12-09 LAB — TSH: TSH: 3.91 u[IU]/mL (ref 0.35–4.50)

## 2014-12-09 MED ORDER — CITALOPRAM HYDROBROMIDE 20 MG PO TABS: ORAL_TABLET | ORAL | Status: DC

## 2014-12-09 MED ORDER — VITAMIN D (ERGOCALCIFEROL) 1.25 MG (50000 UNIT) PO CAPS: 50000.0000 [IU] | ORAL_CAPSULE | ORAL | Status: DC

## 2014-12-09 MED ORDER — TRIAMTERENE-HCTZ 37.5-25 MG PO TABS: ORAL_TABLET | ORAL | Status: DC

## 2014-12-09 NOTE — Addendum Note (Signed)
Addended by: Jackson LatinoYLER, JESSICA L on: 12/09/2014 08:47 AM   Modules accepted: Orders

## 2014-12-09 NOTE — Progress Notes (Signed)
   Subjective:    Patient ID: Mariah Moss, female    DOB: [birth year], 54 y.o.   MRN: [id number]  HPI CPE- UTD on GYN ([facility]), colonoscopy (pt to provide names and dates).  No concerns today.   Review of Systems Patient reports no vision/ hearing changes, adenopathy,fever, weight change,  persistant/recurrent hoarseness , swallowing issues, chest pain, palpitations, edema, persistant/recurrent cough, hemoptysis, dyspnea (rest/exertional/paroxysmal nocturnal), gastrointestinal bleeding (melena, rectal bleeding), abdominal pain, significant heartburn, bowel changes, GU symptoms (dysuria, hematuria, incontinence), Gyn symptoms (abnormal  bleeding, pain),  syncope, focal weakness, memory loss, numbness & tingling, skin/hair/nail changes, abnormal bruising or bleeding, anxiety, or depression.     Objective:   Physical Exam General Appearance:    Alert, cooperative, no distress, appears stated age  Head:    Normocephalic, without obvious abnormality, atraumatic  Eyes:    PERRL, conjunctiva/corneas clear, EOM's intact, fundi    benign, both eyes  Ears:    Normal TM's and external ear canals, both ears  Nose:   Nares normal, septum midline, mucosa normal, no drainage    or sinus tenderness  Throat:   Lips, mucosa, and tongue normal; teeth and gums normal  Neck:   Supple, symmetrical, trachea midline, no adenopathy;    Thyroid: no enlargement/tenderness/nodules  Back:     Symmetric, no curvature, ROM normal, no CVA tenderness  Lungs:     Clear to auscultation bilaterally, respirations unlabored  Chest Wall:    No tenderness or deformity   Heart:    Regular rate and rhythm, S1 and S2 normal, no murmur, rub   or gallop  Breast Exam:    Deferred to GYN  Abdomen:     Soft, non-tender, bowel sounds active all four quadrants,    no masses, no organomegaly  Genitalia:    Deferred to GYN  Rectal:    Extremities:   Extremities normal, atraumatic, no cyanosis or edema  Pulses:   2+ and symmetric all  extremities  Skin:   Skin color, texture, turgor normal, no rashes or lesions  Lymph nodes:   Cervical, supraclavicular, and axillary nodes normal  Neurologic:   CNII-XII intact, normal strength, sensation and reflexes    throughout          Assessment & Plan:

## 2014-12-09 NOTE — Telephone Encounter (Signed)
Reprinted AVS, states pt never smoker.

## 2014-12-09 NOTE — Patient Instructions (Addendum)
Follow up in 6 months to recheck blood pressure We'll notify you of your lab results and make any changes if needed Keep up the good work!  You look great!!! Call with any questions or concerns Happy [place] Birthday!!! [place]

## 2014-12-09 NOTE — Telephone Encounter (Signed)
Per patient, she is not a smoker and would like this changed in her chart. AVS states "smoker"

## 2014-12-09 NOTE — Progress Notes (Signed)
Pre visit review using our clinic review tool, if applicable. No additional management support is needed unless otherwise documented below in the visit note. 

## 2014-12-09 NOTE — Assessment & Plan Note (Signed)
Pt's PE WNL.  UTD on GYN, colonoscopy (pt to provide name of MD).  Check labs.  Tdap updated.  Check labs.  Anticipatory guidance provided.

## 2015-05-25 LAB — HM PAP SMEAR

## 2015-06-16 ENCOUNTER — Ambulatory Visit (INDEPENDENT_AMBULATORY_CARE_PROVIDER_SITE_OTHER): Payer: BLUE CROSS/BLUE SHIELD | Admitting: Family Medicine

## 2015-06-16 ENCOUNTER — Encounter: Payer: Self-pay | Admitting: Family Medicine

## 2015-06-16 VITALS — BP 117/81 | HR 64 | Temp 98.4°F | Ht 64.0 in | Wt 139.6 lb

## 2015-06-16 DIAGNOSIS — I10 Essential (primary) hypertension: Secondary | ICD-10-CM

## 2015-06-16 LAB — CBC WITH DIFFERENTIAL/PLATELET
Basophils Absolute: 0 10*3/uL (ref 0.0–0.1)
Basophils Relative: 0.9 % (ref 0.0–3.0)
Eosinophils Absolute: 0.1 10*3/uL (ref 0.0–0.7)
Eosinophils Relative: 2.4 % (ref 0.0–5.0)
HEMATOCRIT: 45.3 % (ref 36.0–46.0)
HEMOGLOBIN: 15.4 g/dL — AB (ref 12.0–15.0)
LYMPHS PCT: 40.6 % (ref 12.0–46.0)
Lymphs Abs: 1.7 10*3/uL (ref 0.7–4.0)
MCHC: 33.9 g/dL (ref 30.0–36.0)
MCV: 94.3 fl (ref 78.0–100.0)
Monocytes Absolute: 0.4 10*3/uL (ref 0.1–1.0)
Monocytes Relative: 8.3 % (ref 3.0–12.0)
Neutro Abs: 2 10*3/uL (ref 1.4–7.7)
Neutrophils Relative %: 47.8 % (ref 43.0–77.0)
Platelets: 183 10*3/uL (ref 150.0–400.0)
RBC: 4.8 Mil/uL (ref 3.87–5.11)
RDW: 13.4 % (ref 11.5–15.5)
WBC: 4.2 10*3/uL (ref 4.0–10.5)

## 2015-06-16 LAB — BASIC METABOLIC PANEL
BUN: 29 mg/dL — ABNORMAL HIGH (ref 6–23)
CO2: 27 mEq/L (ref 19–32)
CREATININE: 1.07 mg/dL (ref 0.40–1.20)
Calcium: 10.3 mg/dL (ref 8.4–10.5)
Chloride: 107 mEq/L (ref 96–112)
GFR: 56.48 mL/min — AB (ref >60.00)
Glucose, Bld: 92 mg/dL (ref 70–99)
Potassium: 4.6 mEq/L (ref 3.5–5.1)
Sodium: 140 mEq/L (ref 135–145)

## 2015-06-16 NOTE — Assessment & Plan Note (Signed)
Chronic problem.  BP is well controlled.  Asymptomatic.  Check labs.  No anticipated med changes.  Will follow. 

## 2015-06-16 NOTE — Progress Notes (Signed)
   Subjective:    Patient ID: Mariah Moss, female    DOB: 1961-03-13, 55 y.o.   MRN: [id number]  HPI HTN- chronic problem, on Triamterene HCTZ daily.  No CP, SOB, HAs, visual changes, edema.   Limited exercise.  Pt has gained 10 lbs since last visit.   Review of Systems For ROS see HPI     Objective:   Physical Exam  Constitutional: She is oriented to person, place, and time. She appears well-developed and well-nourished. No distress.  HENT:  Head: Normocephalic and atraumatic.  Eyes: Conjunctivae and EOM are normal. Pupils are equal, round, and reactive to light.  Neck: Normal range of motion. Neck supple. No thyromegaly present.  Cardiovascular: Normal rate, regular rhythm, normal heart sounds and intact distal pulses.   No murmur heard. Pulmonary/Chest: Effort normal and breath sounds normal. No respiratory distress.  Abdominal: Soft. She exhibits no distension. There is no tenderness.  Musculoskeletal: She exhibits no edema.  Lymphadenopathy:    She has no cervical adenopathy.  Neurological: She is alert and oriented to person, place, and time.  Skin: Skin is warm and dry.  Psychiatric: She has a normal mood and affect. Her behavior is normal.  Vitals reviewed.         Assessment & Plan:

## 2015-06-16 NOTE — Progress Notes (Signed)
Pre visit review using our clinic review tool, if applicable. No additional management support is needed unless otherwise documented below in the visit note. 

## 2015-06-16 NOTE — Patient Instructions (Signed)
Schedule your complete physical in 6 months We'll notify you of your lab results and make any changes if needed Try and get back into a regular exercise routine- you can do it! Call with any questions or concerns If you want to join [place] at the new [place] office, any scheduled appointments will automatically transfer and we will see you at [address] [place] [address], [place], [place] [address] (OPENING [place]) Happy New Year!!!

## 2015-06-28 ENCOUNTER — Other Ambulatory Visit: Payer: Self-pay | Admitting: Family Medicine

## 2015-06-30 NOTE — Telephone Encounter (Signed)
Medication filled to pharmacy as requested.   

## 2015-09-22 ENCOUNTER — Telehealth: Payer: Self-pay | Admitting: Family Medicine

## 2015-09-22 MED ORDER — CITALOPRAM HYDROBROMIDE 20 MG PO TABS: 20.0000 mg | ORAL_TABLET | Freq: Every day | ORAL | Status: DC

## 2015-09-22 MED ORDER — TRIAMTERENE-HCTZ 37.5-25 MG PO TABS: ORAL_TABLET | ORAL | Status: DC

## 2015-09-22 NOTE — Telephone Encounter (Signed)
Pt needs refill on citalopram and triamlerene, [facility] on [address]

## 2015-09-22 NOTE — Telephone Encounter (Signed)
Medication filled to pharmacy as requested.   

## 2015-11-07 ENCOUNTER — Telehealth: Payer: Self-pay | Admitting: Family Medicine

## 2015-11-07 MED ORDER — TRIAMCINOLONE ACETONIDE 0.1 % EX CREA: 1.0000 "application " | TOPICAL_CREAM | Freq: Two times a day (BID) | CUTANEOUS | Status: DC

## 2015-11-07 NOTE — Telephone Encounter (Signed)
Pt asking if Dr Beverely Lowabori would call in some prednisone, pt states that she has gotten into some poison ivy, [facility] [address], please advise pt when called in

## 2015-11-07 NOTE — Telephone Encounter (Signed)
Unfortunately I cannot call in Prednisone without an office visit.  We can send a triamcinolone ointment for her to apply topically twice daily if she is not able to be seen today (or Saturday clinic tomorrow)

## 2015-11-07 NOTE — Telephone Encounter (Signed)
Spoke with pt and advised of PCP recommendations. Pt declined all appointments stating she will be out of town. Triamcinolone was filled to local pharmacy.

## 2015-12-15 ENCOUNTER — Encounter: Payer: Self-pay | Admitting: Family Medicine

## 2015-12-15 ENCOUNTER — Ambulatory Visit (INDEPENDENT_AMBULATORY_CARE_PROVIDER_SITE_OTHER): Payer: Managed Care, Other (non HMO) | Admitting: Family Medicine

## 2015-12-15 VITALS — BP 113/78 | HR 54 | Temp 98.6°F | Resp 16 | Ht 64.0 in | Wt 145.0 lb

## 2015-12-15 DIAGNOSIS — Z1231 Encounter for screening mammogram for malignant neoplasm of breast: Secondary | ICD-10-CM

## 2015-12-15 DIAGNOSIS — Z0000 Encounter for general adult medical examination without abnormal findings: Secondary | ICD-10-CM

## 2015-12-15 LAB — HEPATIC FUNCTION PANEL
ALBUMIN: 4.2 g/dL (ref 3.5–5.2)
ALK PHOS: 81 U/L (ref 39–117)
ALT: 22 U/L (ref 0–35)
AST: 24 U/L (ref 0–37)
Bilirubin, Direct: 0.1 mg/dL (ref 0.0–0.3)
Total Bilirubin: 0.7 mg/dL (ref 0.2–1.2)
Total Protein: 6.7 g/dL (ref 6.0–8.3)

## 2015-12-15 LAB — CBC WITH DIFFERENTIAL/PLATELET
BASOS PCT: 0.5 % (ref 0.0–3.0)
Basophils Absolute: 0 10*3/uL (ref 0.0–0.1)
EOS ABS: 0.1 10*3/uL (ref 0.0–0.7)
EOS PCT: 2.4 % (ref 0.0–5.0)
HCT: 44.5 % (ref 36.0–46.0)
Hemoglobin: 15 g/dL (ref 12.0–15.0)
LYMPHS ABS: 1.7 10*3/uL (ref 0.7–4.0)
Lymphocytes Relative: 39.2 % (ref 12.0–46.0)
MCHC: 33.6 g/dL (ref 30.0–36.0)
MCV: 95.8 fl (ref 78.0–100.0)
MONO ABS: 0.4 10*3/uL (ref 0.1–1.0)
Monocytes Relative: 9.4 % (ref 3.0–12.0)
NEUTROS PCT: 48.5 % (ref 43.0–77.0)
Neutro Abs: 2.1 10*3/uL (ref 1.4–7.7)
Platelets: 192 10*3/uL (ref 150.0–400.0)
RBC: 4.65 Mil/uL (ref 3.87–5.11)
RDW: 14.2 % (ref 11.5–15.5)
WBC: 4.4 10*3/uL (ref 4.0–10.5)

## 2015-12-15 LAB — BASIC METABOLIC PANEL
BUN: 23 mg/dL (ref 6–23)
CHLORIDE: 105 meq/L (ref 96–112)
CO2: 31 meq/L (ref 19–32)
CREATININE: 0.97 mg/dL (ref 0.40–1.20)
Calcium: 10.9 mg/dL — ABNORMAL HIGH (ref 8.4–10.5)
GFR: 63.13 mL/min (ref >60.00)
Glucose, Bld: 74 mg/dL (ref 70–99)
POTASSIUM: 4.9 meq/L (ref 3.5–5.1)
Sodium: 139 mEq/L (ref 135–145)

## 2015-12-15 LAB — LIPID PANEL
CHOL/HDL RATIO: 3
CHOLESTEROL: 202 mg/dL — AB (ref 0–200)
HDL: 72.1 mg/dL (ref >39.00)
LDL Cholesterol: 105 mg/dL — ABNORMAL HIGH (ref 0–99)
NonHDL: 129.4
TRIGLYCERIDES: 120 mg/dL (ref 0.0–149.0)
VLDL: 24 mg/dL (ref 0.0–40.0)

## 2015-12-15 LAB — TSH: TSH: 3.66 u[IU]/mL (ref 0.35–4.50)

## 2015-12-15 LAB — VITAMIN D 25 HYDROXY (VIT D DEFICIENCY, FRACTURES): VITD: 31.01 ng/mL (ref 30.00–100.00)

## 2015-12-15 MED ORDER — TRIAMTERENE-HCTZ 37.5-25 MG PO TABS: ORAL_TABLET | ORAL | 1 refills | Status: DC

## 2015-12-15 MED ORDER — CITALOPRAM HYDROBROMIDE 20 MG PO TABS: 20.0000 mg | ORAL_TABLET | Freq: Every day | ORAL | 1 refills | Status: DC

## 2015-12-15 NOTE — Progress Notes (Signed)
Pre visit review using our clinic review tool, if applicable. No additional management support is needed unless otherwise documented below in the visit note. 

## 2015-12-15 NOTE — Progress Notes (Signed)
   Subjective:    Patient ID: Mariah Moss, female    DOB: 10/28/1960, 55 y.o.   MRN: [id number]  HPI CPE- UTD on colonoscopy (per report), due for mammo at [facility].  UTD on Tdap.  Due for pap- [facility]   Review of Systems Patient reports no vision/ hearing changes, adenopathy,fever, weight change,  persistant/recurrent hoarseness , swallowing issues, chest pain, palpitations, edema, persistant/recurrent cough, hemoptysis, dyspnea (rest/exertional/paroxysmal nocturnal), gastrointestinal bleeding (melena, rectal bleeding), abdominal pain, significant heartburn, bowel changes, GU symptoms (dysuria, hematuria, incontinence), Gyn symptoms (abnormal  bleeding, pain),  syncope, focal weakness, memory loss, numbness & tingling, skin/hair/nail changes, abnormal bruising or bleeding, anxiety, or depression.     Objective:   Physical Exam General Appearance:    Alert, cooperative, no distress, appears stated age  Head:    Normocephalic, without obvious abnormality, atraumatic  Eyes:    PERRL, conjunctiva/corneas clear, EOM's intact, fundi    benign, both eyes  Ears:    Normal TM's and external ear canals, both ears  Nose:   Nares normal, septum midline, mucosa normal, no drainage    or sinus tenderness  Throat:   Lips, mucosa, and tongue normal; teeth and gums normal  Neck:   Supple, symmetrical, trachea midline, no adenopathy;    Thyroid: no enlargement/tenderness/nodules  Back:     Symmetric, no curvature, ROM normal, no CVA tenderness  Lungs:     Clear to auscultation bilaterally, respirations unlabored  Chest Wall:    No tenderness or deformity   Heart:    Regular rate and rhythm, S1 and S2 normal, no murmur, rub   or gallop  Breast Exam:    Deferred to GYN  Abdomen:     Soft, non-tender, bowel sounds active all four quadrants,    no masses, no organomegaly  Genitalia:    Deferred to GYN  Rectal:    Extremities:   Extremities normal, atraumatic, no cyanosis or edema  Pulses:   2+ and  symmetric all extremities  Skin:   Skin color, texture, turgor normal, no rashes or lesions  Lymph nodes:   Cervical, supraclavicular, and axillary nodes normal  Neurologic:   CNII-XII intact, normal strength, sensation and reflexes    throughout          Assessment & Plan:

## 2015-12-15 NOTE — Patient Instructions (Signed)
Follow up in 6 months to recheck BP We'll notify you of your lab results and make any changes if needed Continue to work on healthy diet and regular exercise- you look great! Call and schedule your GYN appt Call GI and ask them when you are due for repeat colonoscopy- if it was clear in 2011 it should be 10 years The order is in for your mammogram Call with any questions or concerns Happy [place] Birthday!!!

## 2015-12-15 NOTE — Assessment & Plan Note (Signed)
Pt's PE WNL.  Due for mammo- order entered.  Pt encouraged to call GYN and schedule appt.  UTD on colonoscopy.  Check labs.  Anticipatory guidance provided.

## 2015-12-16 ENCOUNTER — Telehealth: Payer: Self-pay | Admitting: General Practice

## 2015-12-16 ENCOUNTER — Other Ambulatory Visit: Payer: Self-pay | Admitting: Family Medicine

## 2015-12-16 NOTE — Telephone Encounter (Signed)
Called pt and LMOVM to inform the need to return for an additional lab draw.

## 2015-12-16 NOTE — Telephone Encounter (Signed)
Please have pt return for PTH at lab of her choice

## 2015-12-16 NOTE — Telephone Encounter (Signed)
Per Clydie Braun at the [facility] lab we cannot add on a PTH as the original specimen was not frozen. Please advise?

## 2016-01-06 ENCOUNTER — Other Ambulatory Visit: Payer: Self-pay | Admitting: Family Medicine

## 2016-01-06 DIAGNOSIS — Z1231 Encounter for screening mammogram for malignant neoplasm of breast: Secondary | ICD-10-CM

## 2016-01-12 ENCOUNTER — Ambulatory Visit: Payer: Managed Care, Other (non HMO)

## 2016-01-13 ENCOUNTER — Ambulatory Visit
Admission: RE | Admit: 2016-01-13 | Discharge: 2016-01-13 | Disposition: A | Discharge status: Home / Self Care | Payer: Managed Care, Other (non HMO) | Source: Ambulatory Visit | Attending: Family Medicine | Admitting: Family Medicine

## 2016-01-13 DIAGNOSIS — Z1231 Encounter for screening mammogram for malignant neoplasm of breast: Secondary | ICD-10-CM

## 2016-01-15 ENCOUNTER — Other Ambulatory Visit: Payer: Self-pay | Admitting: Family Medicine

## 2016-01-15 DIAGNOSIS — R928 Other abnormal and inconclusive findings on diagnostic imaging of breast: Secondary | ICD-10-CM

## 2016-01-20 ENCOUNTER — Inpatient Hospital Stay: Admission: RE | Admit: 2016-01-20 | Payer: Managed Care, Other (non HMO) | Source: Ambulatory Visit

## 2016-01-20 ENCOUNTER — Other Ambulatory Visit: Payer: Managed Care, Other (non HMO)

## 2016-01-21 ENCOUNTER — Ambulatory Visit
Admission: RE | Admit: 2016-01-21 | Discharge: 2016-01-21 | Disposition: A | Discharge status: Home / Self Care | Payer: Managed Care, Other (non HMO) | Source: Ambulatory Visit | Attending: Family Medicine | Admitting: Family Medicine

## 2016-01-21 DIAGNOSIS — R928 Other abnormal and inconclusive findings on diagnostic imaging of breast: Secondary | ICD-10-CM

## 2016-03-20 ENCOUNTER — Other Ambulatory Visit: Payer: Self-pay | Admitting: Family Medicine

## 2016-07-05 ENCOUNTER — Other Ambulatory Visit: Payer: Self-pay | Admitting: Gynecology

## 2016-07-05 DIAGNOSIS — R922 Inconclusive mammogram: Secondary | ICD-10-CM

## 2016-07-07 ENCOUNTER — Ambulatory Visit
Admission: RE | Admit: 2016-07-07 | Discharge: 2016-07-07 | Disposition: A | Discharge status: Home / Self Care | Payer: Managed Care, Other (non HMO) | Source: Ambulatory Visit | Attending: Gynecology | Admitting: Gynecology

## 2016-07-07 DIAGNOSIS — R922 Inconclusive mammogram: Secondary | ICD-10-CM

## 2016-07-07 LAB — HM MAMMOGRAPHY: HM Mammogram: NORMAL (ref 0–4)

## 2016-09-26 ENCOUNTER — Other Ambulatory Visit: Payer: Self-pay | Admitting: Family Medicine

## 2016-09-27 ENCOUNTER — Encounter: Payer: Self-pay | Admitting: General Practice

## 2016-09-27 MED ORDER — CITALOPRAM HYDROBROMIDE 20 MG PO TABS
20.0000 mg | ORAL_TABLET | Freq: Every day | ORAL | 0 refills | Status: DC
Start: 2016-09-27 — End: 2016-09-27

## 2016-09-27 MED ORDER — CITALOPRAM HYDROBROMIDE 20 MG PO TABS
20.0000 mg | ORAL_TABLET | Freq: Every day | ORAL | 0 refills | Status: DC
Start: 2016-09-27 — End: 2016-10-27

## 2016-09-27 MED ORDER — TRIAMTERENE-HCTZ 37.5-25 MG PO TABS: 1.0000 | ORAL_TABLET | Freq: Every day | ORAL | 0 refills | Status: DC

## 2016-10-26 ENCOUNTER — Ambulatory Visit: Payer: Managed Care, Other (non HMO) | Admitting: Family Medicine

## 2016-10-27 ENCOUNTER — Ambulatory Visit (INDEPENDENT_AMBULATORY_CARE_PROVIDER_SITE_OTHER): Payer: BLUE CROSS/BLUE SHIELD | Admitting: Family Medicine

## 2016-10-27 ENCOUNTER — Encounter: Payer: Self-pay | Admitting: Family Medicine

## 2016-10-27 VITALS — BP 112/80 | HR 110 | Temp 98.0°F | Resp 17 | Ht 64.0 in | Wt 136.1 lb

## 2016-10-27 DIAGNOSIS — I10 Essential (primary) hypertension: Secondary | ICD-10-CM

## 2016-10-27 DIAGNOSIS — F419 Anxiety disorder, unspecified: Secondary | ICD-10-CM

## 2016-10-27 LAB — CBC WITH DIFFERENTIAL/PLATELET
BASOS PCT: 1.9 % (ref 0.0–3.0)
Basophils Absolute: 0.1 10*3/uL (ref 0.0–0.1)
EOS PCT: 2.1 % (ref 0.0–5.0)
Eosinophils Absolute: 0.1 10*3/uL (ref 0.0–0.7)
HCT: 45 % (ref 36.0–46.0)
HEMOGLOBIN: 15.2 g/dL — AB (ref 12.0–15.0)
Lymphocytes Relative: 35 % (ref 12.0–46.0)
Lymphs Abs: 1.2 10*3/uL (ref 0.7–4.0)
MCHC: 33.9 g/dL (ref 30.0–36.0)
MCV: 95 fl (ref 78.0–100.0)
MONOS PCT: 9.7 % (ref 3.0–12.0)
Monocytes Absolute: 0.3 10*3/uL (ref 0.1–1.0)
NEUTROS PCT: 51.3 % (ref 43.0–77.0)
Neutro Abs: 1.8 10*3/uL (ref 1.4–7.7)
Platelets: 206 10*3/uL (ref 150.0–400.0)
RBC: 4.73 Mil/uL (ref 3.87–5.11)
RDW: 12.9 % (ref 11.5–15.5)
WBC: 3.4 10*3/uL — ABNORMAL LOW (ref 4.0–10.5)

## 2016-10-27 LAB — BASIC METABOLIC PANEL
BUN: 23 mg/dL (ref 6–23)
CHLORIDE: 99 meq/L (ref 96–112)
CO2: 28 mEq/L (ref 19–32)
Calcium: 11.3 mg/dL — ABNORMAL HIGH (ref 8.4–10.5)
Creatinine, Ser: 0.92 mg/dL (ref 0.40–1.20)
GFR: 66.9 mL/min (ref >60.00)
Glucose, Bld: 66 mg/dL — ABNORMAL LOW (ref 70–99)
POTASSIUM: 4.1 meq/L (ref 3.5–5.1)
SODIUM: 132 meq/L — AB (ref 135–145)

## 2016-10-27 MED ORDER — TRIAMTERENE-HCTZ 37.5-25 MG PO TABS: 1.0000 | ORAL_TABLET | Freq: Every day | ORAL | 6 refills | Status: DC

## 2016-10-27 MED ORDER — CITALOPRAM HYDROBROMIDE 20 MG PO TABS: 20.0000 mg | ORAL_TABLET | Freq: Every day | ORAL | 6 refills | Status: DC

## 2016-10-27 NOTE — Assessment & Plan Note (Signed)
Chronic problem.  Well controlled today.  Asymptomatic.  Check labs.  No anticipated med changes.  Will follow. 

## 2016-10-27 NOTE — Progress Notes (Signed)
Called pt and lmovm to return call.

## 2016-10-27 NOTE — Patient Instructions (Signed)
Schedule your complete physical in 6 months We'll notify you of your lab results and make any changes if needed Keep up the good work on healthy diet and regular exercise- you look great! No med changes at this time Call with any questions or concerns Have a great summer!!!

## 2016-10-27 NOTE — Progress Notes (Signed)
   Subjective:    Patient ID: Mariah Moss, female    DOB: [birth year], 56 y.o.   MRN: [id number]  HPI HTN- chronic problem.  On Triamterene HCTZ daily w/ good control.  Pt has lost 9 lbs since last visit.  Pt reports she is cutting out carbs.  Denies CP, SOB, HAs, visual changes, edema.  Anxiety- chronic problem, on Citalopram daily.  Has considered stopping meds but is concerned that sxs will return.   Review of Systems For ROS see HPI     Objective:   Physical Exam  Constitutional: She is oriented to person, place, and time. She appears well-developed and well-nourished. No distress.  HENT:  Head: Normocephalic and atraumatic.  Eyes: Conjunctivae and EOM are normal. Pupils are equal, round, and reactive to light.  Neck: Normal range of motion. Neck supple. No thyromegaly present.  Cardiovascular: Normal rate, regular rhythm, normal heart sounds and intact distal pulses.   No murmur heard. Pulmonary/Chest: Effort normal and breath sounds normal. No respiratory distress.  Abdominal: Soft. She exhibits no distension. There is no tenderness.  Musculoskeletal: She exhibits no edema.  Lymphadenopathy:    She has no cervical adenopathy.  Neurological: She is alert and oriented to person, place, and time.  Skin: Skin is warm and dry.  Psychiatric: She has a normal mood and affect. Her behavior is normal.  Vitals reviewed.         Assessment & Plan:

## 2016-10-27 NOTE — Assessment & Plan Note (Signed)
Chronic problem.  Well controlled today.  Discussed weaning but pt prefers to continue meds at this time.  Will follow.

## 2016-10-27 NOTE — Progress Notes (Signed)
Pre visit review using our clinic review tool, if applicable. No additional management support is needed unless otherwise documented below in the visit note. 

## 2016-10-28 NOTE — Progress Notes (Signed)
Called pt and lmovm to return call.

## 2016-10-29 ENCOUNTER — Other Ambulatory Visit: Payer: Self-pay | Admitting: Family Medicine

## 2016-10-29 ENCOUNTER — Encounter: Payer: Self-pay | Admitting: Emergency Medicine

## 2016-11-04 ENCOUNTER — Other Ambulatory Visit: Payer: Self-pay | Admitting: Family Medicine

## 2016-11-09 ENCOUNTER — Other Ambulatory Visit (INDEPENDENT_AMBULATORY_CARE_PROVIDER_SITE_OTHER): Payer: BLUE CROSS/BLUE SHIELD

## 2016-11-10 ENCOUNTER — Other Ambulatory Visit: Payer: Self-pay | Admitting: Family Medicine

## 2016-11-10 DIAGNOSIS — E213 Hyperparathyroidism, unspecified: Secondary | ICD-10-CM

## 2016-11-10 LAB — PARATHYROID HORMONE, INTACT (NO CA): PTH: 120 pg/mL — ABNORMAL HIGH (ref 14–64)

## 2016-11-29 ENCOUNTER — Encounter: Payer: Self-pay | Admitting: Endocrinology

## 2016-11-29 ENCOUNTER — Telehealth: Payer: Self-pay

## 2016-11-29 ENCOUNTER — Ambulatory Visit (INDEPENDENT_AMBULATORY_CARE_PROVIDER_SITE_OTHER): Payer: BLUE CROSS/BLUE SHIELD | Admitting: Endocrinology

## 2016-11-29 VITALS — BP 112/72 | HR 62 | Ht 64.0 in | Wt 135.8 lb

## 2016-11-29 DIAGNOSIS — E049 Nontoxic goiter, unspecified: Secondary | ICD-10-CM | POA: Diagnosis not present

## 2016-11-29 DIAGNOSIS — E213 Hyperparathyroidism, unspecified: Secondary | ICD-10-CM

## 2016-11-29 DIAGNOSIS — N951 Menopausal and female climacteric states: Secondary | ICD-10-CM | POA: Diagnosis not present

## 2016-11-29 LAB — BASIC METABOLIC PANEL
BUN: 23 mg/dL (ref 6–23)
CALCIUM: 11.1 mg/dL — AB (ref 8.4–10.5)
CO2: 28 mEq/L (ref 19–32)
Chloride: 106 mEq/L (ref 96–112)
Creatinine, Ser: 0.96 mg/dL (ref 0.40–1.20)
GFR: 63.67 mL/min (ref >60.00)
GLUCOSE: 100 mg/dL — AB (ref 70–99)
Potassium: 4.4 mEq/L (ref 3.5–5.1)
SODIUM: 138 meq/L (ref 135–145)

## 2016-11-29 LAB — TSH: TSH: 4.03 u[IU]/mL (ref 0.35–4.50)

## 2016-11-29 LAB — VITAMIN D 25 HYDROXY (VIT D DEFICIENCY, FRACTURES): VITD: 31.89 ng/mL (ref 30.00–100.00)

## 2016-11-29 LAB — T4, FREE: Free T4: 0.81 ng/dL (ref 0.60–1.60)

## 2016-11-29 NOTE — Progress Notes (Signed)
Patient ID: Mariah Moss, female   DOB: 17-Oct-1960, 56 y.o.   MRN: [id number]            Chief complaint: High calcium   Referring physician: Beverely Low  History of Present Illness:  She has been referred here for a relatively higher calcium level done in June   Review of records show that she has had a high calcium since 2013 and this has been generally between 10.3-10.9 She has been monitored about once or twice a year for her hypercalcemia She is being referred now because of her calcium being higher than usual, also had a parathyroid hormone level done in June  Lab Results  Component Value Date   CALCIUM 11.3 (H) 10/27/2016   CALCIUM 10.9 (H) 12/15/2015   CALCIUM 10.3 06/16/2015   CALCIUM 10.8 (H) 12/09/2014   CALCIUM 10.9 (H) 12/06/2013   CALCIUM 10.6 (H) 09/21/2012   CALCIUM 10.6 (H) 10/29/2011   CALCIUM 10.7 (H) 10/14/2011    The hypercalcemia is not associated with any pathologic fractures, renal insufficiency, Kidney stones, sarcoidosis, known carcinoma, known thyroid disease  She says she feels fairly good with her energy level and has no nausea or lethargy.   Prior serologic and radiologic studies have included:  Lab Results  Component Value Date   PTH 120 (H) 11/09/2016   CALCIUM 11.3 (H) 10/27/2016      25 (OH) Vitamin D level Has been low normal or mildly low in 2017     Allergies as of 11/29/2016   No Known Allergies     Medication List       Accurate as of 11/29/16 11:23 AM. Always use your most recent med list.          citalopram 20 MG tablet Commonly known as:  CELEXA TAKE 1 TABLET BY MOUTH EVERY DAY   triamterene-hydrochlorothiazide 37.5-25 MG tablet Commonly known as:  MAXZIDE-25 TAKE 1 TABLET BY MOUTH EVERY DAY       Allergies: No Known Allergies  Past Medical History:  Diagnosis Date  . Heart murmur   . Hypertension     Past Surgical History:  Procedure Laterality Date  . BUNIONECTOMY  2000    Family History  Problem  Relation Age of Onset  . Heart disease Mother   . Stroke Mother   . Hypertension Mother   . Cancer Father   . Diabetes Father     Social History:  reports that she has never smoked. She has never used smokeless tobacco. She reports that she drinks alcohol. She reports that she does not use drugs.  Review of Systems  Constitutional: Negative for weight loss.  HENT: Negative for trouble swallowing.   Respiratory: Negative for daytime sleepiness.   Cardiovascular: Negative for leg swelling.  Gastrointestinal: Negative for constipation.  Endocrine: Negative for fatigue and cold intolerance.  Genitourinary:       No history of kidney stones  Musculoskeletal: Negative for joint pain and back pain.  Skin: Negative for rash.  Neurological: Negative for weakness.   Long-standing history of hypertension, treated with Maxzide 25 mg  BP Readings from Last 3 Encounters:  11/29/16 112/72  10/27/16 112/80  12/15/15 113/78   No prior knowledge of thyroid problems or thyroid enlargement  Lab Results  Component Value Date   TSH 3.66 12/15/2015   Taking long-term Celexa for anxiety   EXAM:  BP 112/72   Pulse 62   Ht 5\' 4"  (1.626 m)   Wt 135 lb 12.8 oz (  61.6 kg)   LMP 12/14/2011   SpO2 98%   BMI 23.31 kg/m   GENERAL: Averagely built and nourished  No pallor, clubbing, lymphadenopathy in the neck or edema.    Skin:  no rash or pigmentation.  EYES:  Externally normal.  Cornea and iris look normal.  Fundii:  normal discs and vessels.  ENT: Oral mucosa and tongue normal.  THYROID:  This is palpable on the right side.  She has a firm irregular enlargement about 2 times normal without any specific nodules probable.  Left side is not palpable  HEART:  Normal  S1 and S2; no murmur or click.  CHEST:  Normal shape Lungs:   Vescicular breath sounds heard equally.  No crepitations/ wheeze.  ABDOMEN:  No distention.  Liver and spleen not palpable.  No other mass or  tenderness.  NEUROLOGICAL: Reflexes are brisk bilaterally at biceps.  SPINE AND JOINTS:  Normal shape of spine, normal peripheral joints    Assessment/Plan:   HYPERCALCEMIA: She has had mild chronic hypercalcemia for several years This appears to be from primary hyperparathyroidism based on her high PTH level and mild chronic elevation Some of her hypercalcemia may be accentuated by her taking hydrochlorothiazide  Discussed the nature of primary hyperparathyroidism as well as normal role of the parathyroid glands. Discussed potential  effects of hyperparathyroidism long-term on bone health, kidney stones and kidney function Explained to patient that surgery is indicated only there are symptoms of high calcium, calcium level over 1 point above the normal range or known osteoporosis.  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

## 2016-11-29 NOTE — Telephone Encounter (Signed)
Called [place] office to schedule the Bone Density test for patient on Monday, July 16th at 9:30am.

## 2016-12-02 ENCOUNTER — Other Ambulatory Visit: Payer: Self-pay | Admitting: Endocrinology

## 2016-12-02 ENCOUNTER — Other Ambulatory Visit: Payer: Self-pay | Admitting: General Practice

## 2016-12-02 DIAGNOSIS — E049 Nontoxic goiter, unspecified: Secondary | ICD-10-CM

## 2016-12-02 MED ORDER — CITALOPRAM HYDROBROMIDE 20 MG PO TABS: 20.0000 mg | ORAL_TABLET | Freq: Every day | ORAL | 0 refills | Status: DC

## 2016-12-02 NOTE — Progress Notes (Signed)
Please call to let patient know that the thyroid results are normal and she will be getting a call to get her thyroid ultrasound done Calcium is slightly better at 11.1 compared to 11.3 and no further action needed; vitamin D okay

## 2016-12-06 ENCOUNTER — Ambulatory Visit (INDEPENDENT_AMBULATORY_CARE_PROVIDER_SITE_OTHER)
Admission: RE | Admit: 2016-12-06 | Discharge: 2016-12-06 | Disposition: A | Discharge status: Home / Self Care | Payer: BLUE CROSS/BLUE SHIELD | Source: Ambulatory Visit | Attending: Endocrinology | Admitting: Endocrinology

## 2016-12-06 DIAGNOSIS — E213 Hyperparathyroidism, unspecified: Secondary | ICD-10-CM

## 2016-12-06 DIAGNOSIS — N951 Menopausal and female climacteric states: Secondary | ICD-10-CM

## 2016-12-09 NOTE — Progress Notes (Signed)
Please call to let patient know that the bone density shows low bone mass at the hip would not osteoporosis, not needing medication at this time, needs follow-up and office visit for repeat calcium level in 6 months Also she needs to let us know if thyroid ultrasound is still not scheduled

## 2016-12-10 ENCOUNTER — Telehealth: Payer: Self-pay | Admitting: Endocrinology

## 2016-12-10 NOTE — Telephone Encounter (Signed)
Patient returning call about scheduling f/u for 6 month calcium recheck and ultrasound for her thyroid. I went ahead and scheduled her f/u for calcium recheck and told her a nurse would be in contact with her to schedule an appointment for the ultrasound.   Please call patient and advise.

## 2016-12-10 NOTE — Telephone Encounter (Signed)
Routing to you °

## 2016-12-10 NOTE — Telephone Encounter (Signed)
Her thyroid ultrasound referral has been done, [facility] why she has not been scheduled

## 2016-12-13 NOTE — Telephone Encounter (Signed)
Called [facility] at [phone] and spoke to Maralyn SagoSarah and she stated that they had called the patient and will try to call her again to set up the appointment.   Called patient and let her know that I spoke with someone and they will be calling her soon to schedule her thyroid ultrasound.

## 2016-12-20 ENCOUNTER — Other Ambulatory Visit: Payer: Self-pay | Admitting: Family Medicine

## 2016-12-20 DIAGNOSIS — N6489 Other specified disorders of breast: Secondary | ICD-10-CM

## 2016-12-21 ENCOUNTER — Ambulatory Visit
Admission: RE | Admit: 2016-12-21 | Discharge: 2016-12-21 | Disposition: A | Discharge status: Home / Self Care | Payer: BLUE CROSS/BLUE SHIELD | Source: Ambulatory Visit | Attending: Endocrinology | Admitting: Endocrinology

## 2016-12-21 DIAGNOSIS — E049 Nontoxic goiter, unspecified: Secondary | ICD-10-CM

## 2016-12-21 NOTE — Progress Notes (Signed)
Please call to let patient know that the ultrasound does not show any significant nodules; right side is slightly larger than normal but no action needed

## 2017-01-11 LAB — HM PAP SMEAR

## 2017-01-13 ENCOUNTER — Ambulatory Visit
Admission: RE | Admit: 2017-01-13 | Discharge: 2017-01-13 | Disposition: A | Discharge status: Home / Self Care | Payer: BLUE CROSS/BLUE SHIELD | Source: Ambulatory Visit | Attending: Family Medicine | Admitting: Family Medicine

## 2017-01-13 DIAGNOSIS — N6489 Other specified disorders of breast: Secondary | ICD-10-CM

## 2017-01-14 ENCOUNTER — Encounter: Payer: Self-pay | Admitting: General Practice

## 2017-03-26 ENCOUNTER — Other Ambulatory Visit: Payer: Self-pay | Admitting: Family Medicine

## 2017-04-26 ENCOUNTER — Ambulatory Visit (INDEPENDENT_AMBULATORY_CARE_PROVIDER_SITE_OTHER): Payer: BLUE CROSS/BLUE SHIELD | Admitting: Family Medicine

## 2017-04-26 ENCOUNTER — Other Ambulatory Visit: Payer: Self-pay

## 2017-04-26 ENCOUNTER — Encounter: Payer: Self-pay | Admitting: Family Medicine

## 2017-04-26 VITALS — BP 116/80 | HR 76 | Temp 98.0°F | Resp 16 | Ht 64.0 in | Wt 144.5 lb

## 2017-04-26 DIAGNOSIS — Z0000 Encounter for general adult medical examination without abnormal findings: Secondary | ICD-10-CM | POA: Diagnosis not present

## 2017-04-26 DIAGNOSIS — I10 Essential (primary) hypertension: Secondary | ICD-10-CM

## 2017-04-26 DIAGNOSIS — Z23 Encounter for immunization: Secondary | ICD-10-CM | POA: Diagnosis not present

## 2017-04-26 LAB — CBC WITH DIFFERENTIAL/PLATELET
BASOS PCT: 1.5 % (ref 0.0–3.0)
Basophils Absolute: 0.1 10*3/uL (ref 0.0–0.1)
EOS ABS: 0.1 10*3/uL (ref 0.0–0.7)
EOS PCT: 2.8 % (ref 0.0–5.0)
HEMATOCRIT: 47.2 % — AB (ref 36.0–46.0)
HEMOGLOBIN: 15.7 g/dL — AB (ref 12.0–15.0)
LYMPHS PCT: 33.3 % (ref 12.0–46.0)
Lymphs Abs: 1.4 10*3/uL (ref 0.7–4.0)
MCHC: 33.3 g/dL (ref 30.0–36.0)
MCV: 98.7 fl (ref 78.0–100.0)
Monocytes Absolute: 0.4 10*3/uL (ref 0.1–1.0)
Monocytes Relative: 10 % (ref 3.0–12.0)
Neutro Abs: 2.3 10*3/uL (ref 1.4–7.7)
Neutrophils Relative %: 52.4 % (ref 43.0–77.0)
Platelets: 214 10*3/uL (ref 150.0–400.0)
RBC: 4.78 Mil/uL (ref 3.87–5.11)
RDW: 13.7 % (ref 11.5–15.5)
WBC: 4.3 10*3/uL (ref 4.0–10.5)

## 2017-04-26 LAB — BASIC METABOLIC PANEL
BUN: 21 mg/dL (ref 6–23)
CALCIUM: 11.3 mg/dL — AB (ref 8.4–10.5)
CO2: 28 mEq/L (ref 19–32)
CREATININE: 1.07 mg/dL (ref 0.40–1.20)
Chloride: 105 mEq/L (ref 96–112)
GFR: 56.1 mL/min — ABNORMAL LOW (ref >60.00)
Glucose, Bld: 97 mg/dL (ref 70–99)
Potassium: 5.1 mEq/L (ref 3.5–5.1)
Sodium: 140 mEq/L (ref 135–145)

## 2017-04-26 LAB — HEPATIC FUNCTION PANEL
ALBUMIN: 4.5 g/dL (ref 3.5–5.2)
ALT: 22 U/L (ref 0–35)
AST: 21 U/L (ref 0–37)
Alkaline Phosphatase: 74 U/L (ref 39–117)
BILIRUBIN TOTAL: 0.7 mg/dL (ref 0.2–1.2)
Bilirubin, Direct: 0.1 mg/dL (ref 0.0–0.3)
Total Protein: 7.4 g/dL (ref 6.0–8.3)

## 2017-04-26 LAB — LIPID PANEL
CHOL/HDL RATIO: 3
CHOLESTEROL: 245 mg/dL — AB (ref 0–200)
HDL: 84.7 mg/dL (ref >39.00)
LDL CALC: 147 mg/dL — AB (ref 0–99)
NONHDL: 160
TRIGLYCERIDES: 65 mg/dL (ref 0.0–149.0)
VLDL: 13 mg/dL (ref 0.0–40.0)

## 2017-04-26 LAB — TSH: TSH: 4.56 u[IU]/mL — AB (ref 0.35–4.50)

## 2017-04-26 NOTE — Assessment & Plan Note (Signed)
Chronic problem.  Well controlled today.  Asymptomatic.  Check labs.  No anticipated med changes.  Will follow. 

## 2017-04-26 NOTE — Assessment & Plan Note (Signed)
Pt's PE WNL.  UTD on GYN, colonoscopy, Tdap.  Flu shot given today.  Check labs.  Anticipatory guidance provided.  

## 2017-04-26 NOTE — Patient Instructions (Addendum)
Follow up in 6 months to recheck BP We'll notify you of your lab results and make any changes if needed Add a daily Claritin or Zyrtec to help with your postnasal drip and the constant clearing of your throat Continue to work on healthy diet and regular exercise- you look great! Call with any questions or concerns Happy Holidays!!!

## 2017-04-26 NOTE — Progress Notes (Signed)
   Subjective:    Patient ID: Mariah BertinKelly Moss, female    DOB: 05-May-1961, [age] y.o.   MRN: [id number]  HPI CPE- UTD on pap, mammo, Tdap.  Due for flu.  UTD on colonoscopy.   Review of Systems Patient reports no vision/ hearing changes, adenopathy,fever, weight change,  persistant/recurrent hoarseness , swallowing issues, chest pain, palpitations, edema, persistant/recurrent cough, hemoptysis, dyspnea (rest/exertional/paroxysmal nocturnal), gastrointestinal bleeding (melena, rectal bleeding), abdominal pain, significant heartburn, bowel changes, GU symptoms (dysuria, hematuria, incontinence), Gyn symptoms (abnormal  bleeding, pain),  syncope, focal weakness, memory loss, numbness & tingling, skin/hair/nail changes, abnormal bruising or bleeding, anxiety, or depression.     Objective:   Physical Exam General Appearance:    Alert, cooperative, no distress, appears stated age  Head:    Normocephalic, without obvious abnormality, atraumatic  Eyes:    PERRL, conjunctiva/corneas clear, EOM's intact, fundi    benign, both eyes  Ears:    Normal TM's and external ear canals, both ears  Nose:   Nares normal, septum midline, mucosa normal, no drainage    or sinus tenderness  Throat:   Lips, mucosa, and tongue normal; teeth and gums normal  Neck:   Supple, symmetrical, trachea midline, no adenopathy;    Thyroid: no enlargement/tenderness/nodules  Back:     Symmetric, no curvature, ROM normal, no CVA tenderness  Lungs:     Clear to auscultation bilaterally, respirations unlabored  Chest Wall:    No tenderness or deformity   Heart:    Regular rate and rhythm, S1 and S2 normal, no murmur, rub   or gallop  Breast Exam:    Deferred to GYN  Abdomen:     Soft, non-tender, bowel sounds active all four quadrants,    no masses, no organomegaly  Genitalia:    Deferred to GYN  Rectal:    Extremities:   Extremities normal, atraumatic, no cyanosis or edema  Pulses:   2+ and symmetric all extremities  Skin:   Skin  color, texture, turgor normal, no rashes or lesions  Lymph nodes:   Cervical, supraclavicular, and axillary nodes normal  Neurologic:   CNII-XII intact, normal strength, sensation and reflexes    throughout          Assessment & Plan:

## 2017-04-27 ENCOUNTER — Other Ambulatory Visit: Payer: Self-pay | Admitting: General Practice

## 2017-04-27 DIAGNOSIS — E039 Hypothyroidism, unspecified: Secondary | ICD-10-CM

## 2017-04-27 MED ORDER — LEVOTHYROXINE SODIUM 50 MCG PO TABS: 50.0000 ug | ORAL_TABLET | Freq: Every day | ORAL | 6 refills | Status: DC

## 2017-05-09 DIAGNOSIS — D225 Melanocytic nevi of trunk: Secondary | ICD-10-CM | POA: Diagnosis not present

## 2017-05-09 DIAGNOSIS — Z1283 Encounter for screening for malignant neoplasm of skin: Secondary | ICD-10-CM | POA: Diagnosis not present

## 2017-05-30 ENCOUNTER — Other Ambulatory Visit (INDEPENDENT_AMBULATORY_CARE_PROVIDER_SITE_OTHER): Payer: BLUE CROSS/BLUE SHIELD

## 2017-05-30 ENCOUNTER — Encounter: Payer: Self-pay | Admitting: General Practice

## 2017-05-30 DIAGNOSIS — E039 Hypothyroidism, unspecified: Secondary | ICD-10-CM | POA: Diagnosis not present

## 2017-05-30 LAB — TSH: TSH: 2.8 u[IU]/mL (ref 0.35–4.50)

## 2017-06-01 ENCOUNTER — Encounter: Payer: Self-pay | Admitting: Family Medicine

## 2017-06-12 NOTE — Progress Notes (Signed)
Patient ID: Mariah Moss, female   DOB: Sep 24, 1960, 57 y.o.   MRN: [id number]            Chief complaint: High calcium and thyroid follow-up   Referring physician: Beverely Moss  History of Present Illness:  She was seen in initial consultation for hypercalcemia in 11/2016   Review of records show that she has had a high calcium since 2013 and this has been generally between 10.3-10.9 However has been above 11 since about 10/2016 She has been monitored about once or twice a year for her hypercalcemia    Lab Results  Component Value Date   CALCIUM 11.3 (H) 04/26/2017   CALCIUM 11.1 (H) 11/29/2016   CALCIUM 11.3 (H) 10/27/2016   CALCIUM 10.9 (H) 12/15/2015   CALCIUM 10.3 06/16/2015   CALCIUM 10.8 (H) 12/09/2014   CALCIUM 10.9 (H) 12/06/2013   CALCIUM 10.6 (H) 09/21/2012   CALCIUM 10.6 (H) 10/29/2011    The hypercalcemia is not associated with any pathologic fractures, renal insufficiency, Kidney stones   Menopause at occurred at about age 45  Recently has been feeling good with her energy level No significant weight change  Prior serologic and radiologic studies have included:  Lab Results  Component Value Date   PTH 120 (H) 11/09/2016   CALCIUM 11.3 (H) 04/26/2017      25 (OH) Vitamin D level Has been Moss normal or mildly Moss in 2017   Bone density done on 12/21/16 showed:   Lumbar spine L1-L4 (L3) Femoral neck (FN) 33% distal R radius  T-score -0.8 RFN: -1.8 LFN: -1.7 -1.5    THYROID history: See review of systems   Allergies as of 06/13/2017   No Known Allergies     Medication List        Accurate as of 06/13/17  1:09 PM. Always use your most recent med list.          citalopram 20 MG tablet Commonly known as:  CELEXA TAKE 1 TABLET BY MOUTH EVERY DAY   levothyroxine 50 MCG tablet Commonly known as:  SYNTHROID, LEVOTHROID Take 1 tablet (50 mcg total) by mouth daily.   triamterene-hydrochlorothiazide 37.5-25 MG tablet Commonly known as:   MAXZIDE-25 TAKE 1 TABLET BY MOUTH EVERY DAY       Allergies: No Known Allergies  Past Medical History:  Diagnosis Date  . Heart murmur   . Hypertension     Past Surgical History:  Procedure Laterality Date  . BUNIONECTOMY  2000    Family History  Problem Relation Age of Onset  . Heart disease Mother   . Stroke Mother   . Hypertension Mother   . Cancer Father   . Diabetes Father     Social History:  reports that  has never smoked. she has never used smokeless tobacco. She reports that she drinks alcohol. She reports that she does not use drugs.  Review of Systems   Long-standing history of hypertension, treated with Maxzide 25 mg for several years  BP Readings from Last 3 Encounters:  06/13/17 118/80  04/26/17 116/80  11/29/16 112/72    HYPOTHYROIDISM:  Patient has been followed for abnormal TSH recently by PCP She does not report any unusual fatigue, sleepiness or lethargy  She also was felt to have a goiter on her initial exam in 7/18 but ultrasound showed only heterogenous thyroid enlargement and no nodules  In 12/18 at her annual exam she was recommended starting 50 g of levothyroxine by her PCP However she does  not feel any different with starting this TSH is lower than usual now at 2.8  Lab Results  Component Value Date   TSH 2.80 05/30/2017   TSH 4.56 (H) 04/26/2017   TSH 4.03 11/29/2016   FREET4 0.81 11/29/2016    Taking long-term Celexa for anxiety   EXAM:  BP 118/80   Pulse 67   Ht 5\' 4"  (1.626 m)   Wt 149 lb (67.6 kg)   LMP 12/14/2011   SpO2 97%   BMI 25.58 kg/m       Assessment/Plan:   HYPERCALCEMIA: She has had mild chronic hypercalcemia for several years She has primary hyperparathyroidism based on her high PTH level   Some of her hypercalcemia may be accentuated by her taking hydrochlorothiazide 25 mg  She has no symptoms However not clear if her osteopenia is related to hyperparathyroidism or menopause since no prior  bone density available comparison  Discussed the nature of primary hyperparathyroidism as well as effects on bone density and fracture risk She does not have any deficiencies of vitamin D  Recommend reducing her Maxzide to half tablet, she will discuss with her PCP; this may reduce the degree of hypercalcemia Discussed in detail need to prevent worsening of her osteopenia and that treatment for actively would be beneficial  She is a good candidate for taking Evista long-term or Moss dose 5 mg Fosamax weekly Discussed advantages of either medication, benefits, possible side effects and effects on bone density Fosamax probably can be used for up to 10 years She will think about her options and call us back for further management Otherwise we'll have her calcium checked every 6 months And much she can continue to have with breathing exercises, does not need specific vitamin D supplementation  THYROID enlargement and mild increase in TSH: Most likely she has Hashimoto thyroiditis However she appears to be asymptomatic even with her increased TSH in December She has not had any change in her energy level with taking thyroid supplement and she is not interested in long-term medications Agreed with patient that we can try her off the medication for some time and periodically check TSH If she has usual fatigue or TSH close to 10 or so she may be started back on levothyroxine  Counseling time on subjects discussed in assessment and plan sections is over 50% of today's 25 minute visit   Reather LittlerAjay Wing Gfeller 06/13/2017, 1:09 PM

## 2017-06-13 ENCOUNTER — Encounter: Payer: Self-pay | Admitting: Family Medicine

## 2017-06-13 ENCOUNTER — Ambulatory Visit (INDEPENDENT_AMBULATORY_CARE_PROVIDER_SITE_OTHER): Payer: BLUE CROSS/BLUE SHIELD | Admitting: Endocrinology

## 2017-06-13 ENCOUNTER — Encounter: Payer: Self-pay | Admitting: Endocrinology

## 2017-06-13 VITALS — BP 118/80 | HR 67 | Ht 64.0 in | Wt 149.0 lb

## 2017-06-13 DIAGNOSIS — E039 Hypothyroidism, unspecified: Secondary | ICD-10-CM

## 2017-06-13 DIAGNOSIS — E213 Hyperparathyroidism, unspecified: Secondary | ICD-10-CM

## 2017-06-13 DIAGNOSIS — E038 Other specified hypothyroidism: Secondary | ICD-10-CM

## 2017-06-14 NOTE — Progress Notes (Signed)
Spoke with pt, she will cut her pills in 1/2. Pt was scheduled for 07/11/17 at 8am.

## 2017-06-27 ENCOUNTER — Other Ambulatory Visit: Payer: Self-pay | Admitting: General Practice

## 2017-06-27 MED ORDER — CITALOPRAM HYDROBROMIDE 20 MG PO TABS: 20.0000 mg | ORAL_TABLET | Freq: Every day | ORAL | 0 refills | Status: DC

## 2017-07-11 ENCOUNTER — Ambulatory Visit (INDEPENDENT_AMBULATORY_CARE_PROVIDER_SITE_OTHER): Payer: BLUE CROSS/BLUE SHIELD | Admitting: Family Medicine

## 2017-07-11 ENCOUNTER — Encounter: Payer: Self-pay | Admitting: Family Medicine

## 2017-07-11 ENCOUNTER — Other Ambulatory Visit: Payer: Self-pay

## 2017-07-11 VITALS — BP 129/82 | HR 57 | Temp 98.3°F | Resp 16 | Ht 64.0 in | Wt 147.2 lb

## 2017-07-11 DIAGNOSIS — I10 Essential (primary) hypertension: Secondary | ICD-10-CM | POA: Diagnosis not present

## 2017-07-11 NOTE — Assessment & Plan Note (Signed)
Chronic problem.  Adequate control today.  Asymptomatic w/ exception of stress HAs.  No need for labs today.  Continue 1/2 tab of Maxzide and continue to monitor BP.  Reviewed supportive care and red flags that should prompt return.  Pt expressed understanding and is in agreement w/ plan.

## 2017-07-11 NOTE — Progress Notes (Signed)
   Subjective:    Patient ID: Mariah Moss, female    DOB: 01-30-1961, 57 y.o.   MRN: [id number]  HPI HTN- chronic problem.  Dr Lucianne MussKumar recommended that decrease her Triamterene HCTZ to 1/2 tab daily.  BP remains adequately controlled.  Denies CP, SOB, visual changes, edema.  + intermittent HAs but pt feels this is stress related.   Review of Systems For ROS see HPI     Objective:   Physical Exam  Constitutional: She is oriented to person, place, and time. She appears well-developed and well-nourished. No distress.  HENT:  Head: Normocephalic and atraumatic.  Eyes: Conjunctivae and EOM are normal. Pupils are equal, round, and reactive to light.  Neck: Normal range of motion. Neck supple. No thyromegaly present.  Cardiovascular: Normal rate, regular rhythm, normal heart sounds and intact distal pulses.  No murmur heard. Pulmonary/Chest: Effort normal and breath sounds normal. No respiratory distress.  Abdominal: Soft. She exhibits no distension. There is no tenderness.  Musculoskeletal: She exhibits no edema.  Lymphadenopathy:    She has no cervical adenopathy.  Neurological: She is alert and oriented to person, place, and time.  Skin: Skin is warm and dry.  Psychiatric: She has a normal mood and affect. Her behavior is normal.  Vitals reviewed.         Assessment & Plan:

## 2017-07-11 NOTE — Patient Instructions (Signed)
Follow up in June to recheck BP and cholesterol Continue the 1/2 tab of Triamterene HCTZ daily I would continue to check your home BPs and if consistently >140/90, please let me know! Keep up the good work on healthy diet and regular exercise- you look great! Call with any questions or concerns Happy Spring!!!

## 2017-09-04 ENCOUNTER — Other Ambulatory Visit: Payer: Self-pay | Admitting: Family Medicine

## 2017-09-11 IMAGING — MG 2D DIGITAL DIAGNOSTIC BILATERAL MAMMOGRAM WITH CAD AND ADJUNCT T
8 of 12 series · 8 of 28 positions shown · non-contrast
Comparison: Previous exam(s).

CLINICAL DATA: Patient presents for bilateral diagnostic
examination to evaluate questionable distortion over the upper right
breast.

EXAM:
2D DIGITAL DIAGNOSTIC BILATERAL MAMMOGRAM WITH CAD AND ADJUNCT TOMO

[L MLO synth-2D]
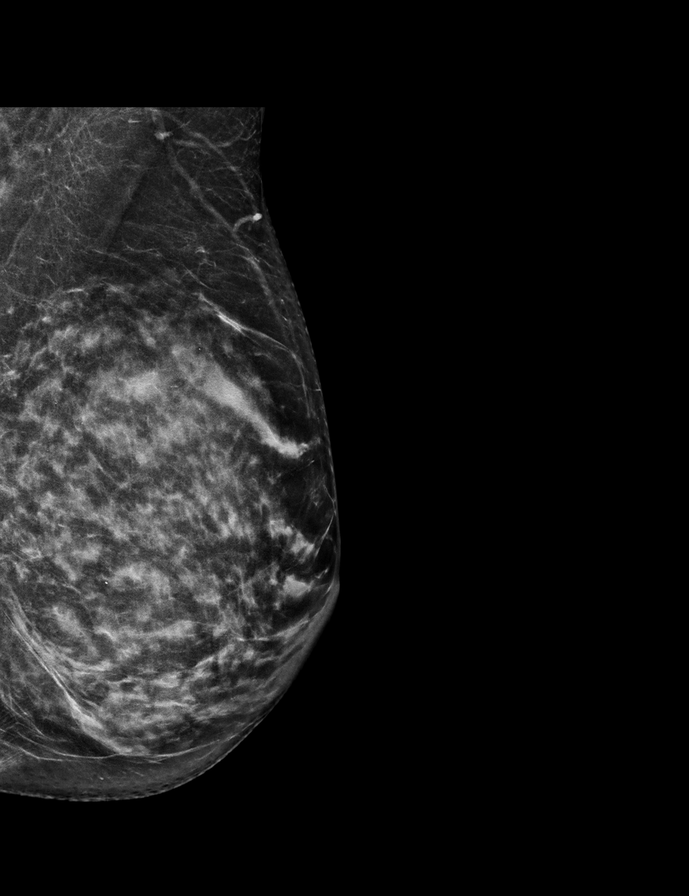

[L MLO]
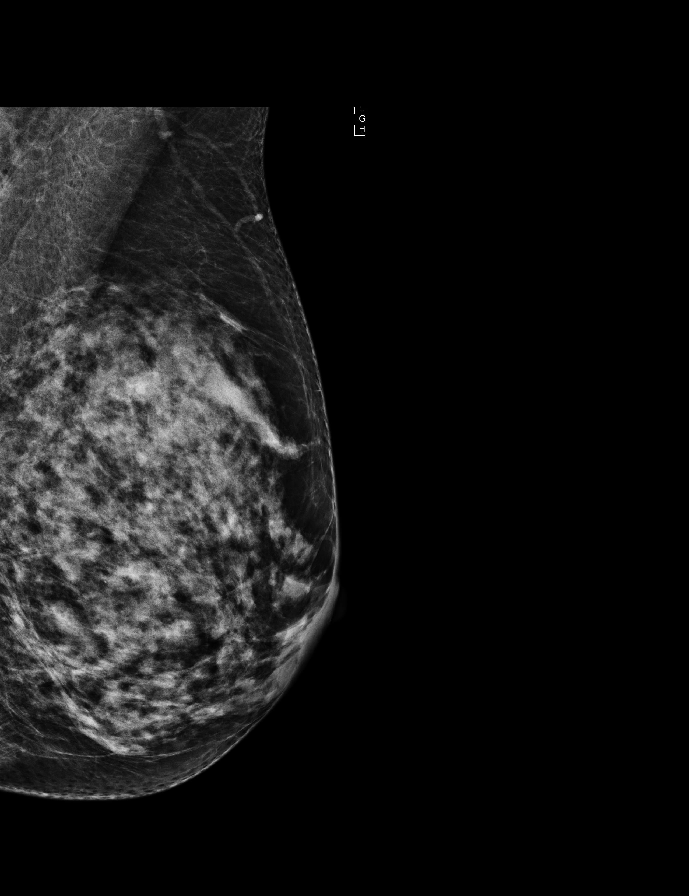

[R MLO synth-2D]
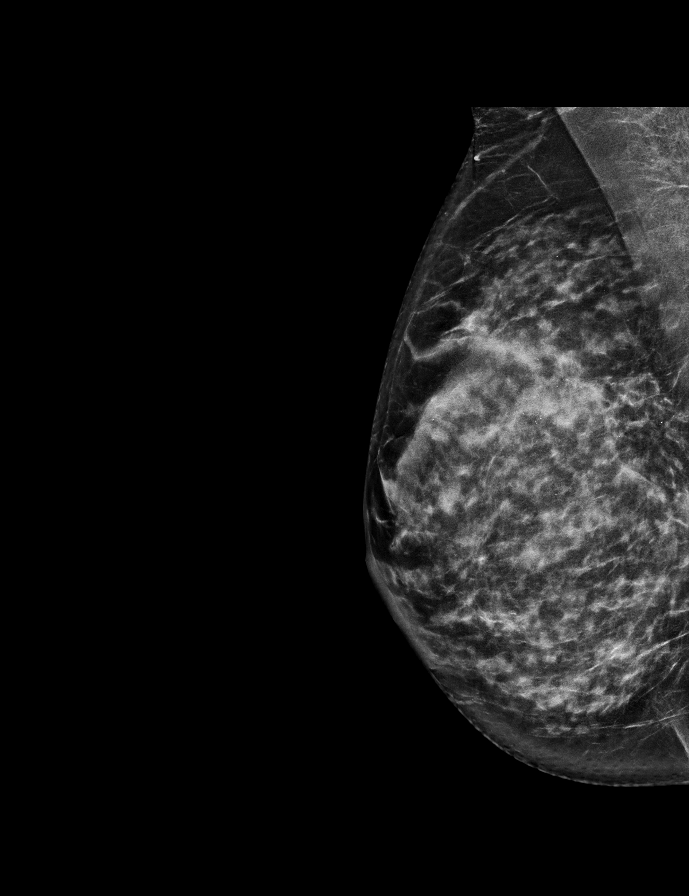

[R MLO]
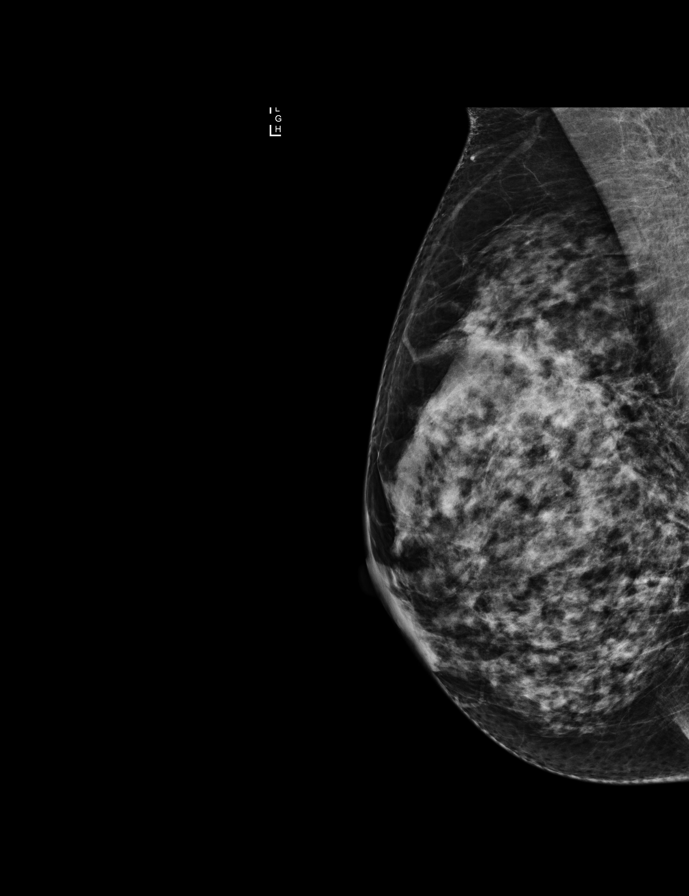

[L CC]
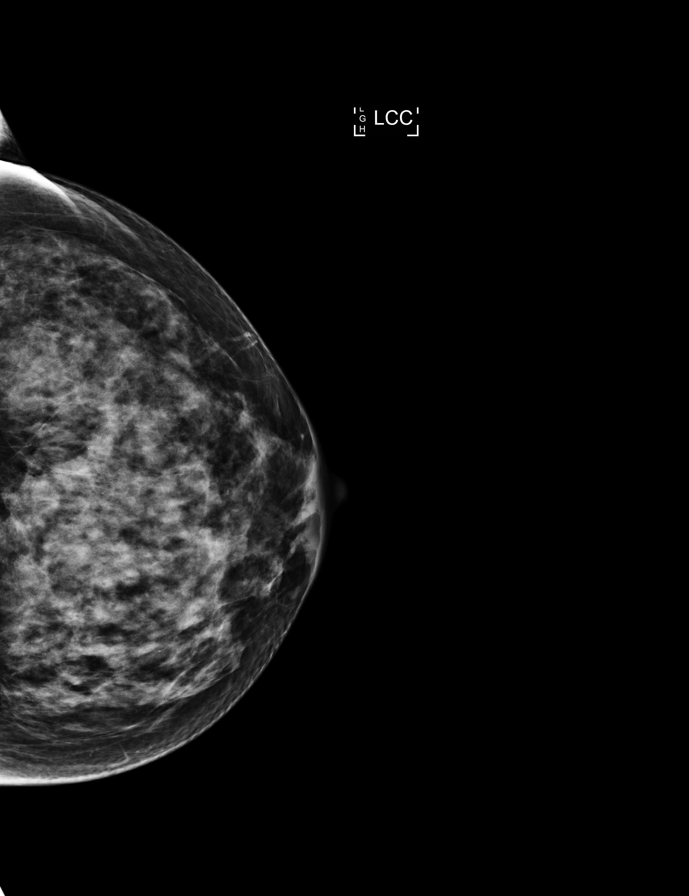

[L CC synth-2D]
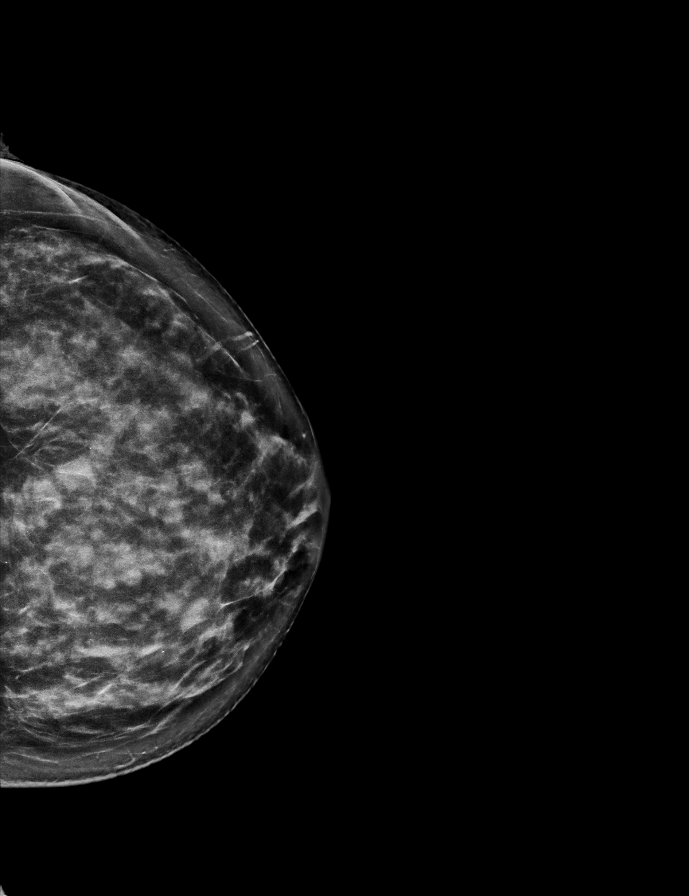

[R CC]
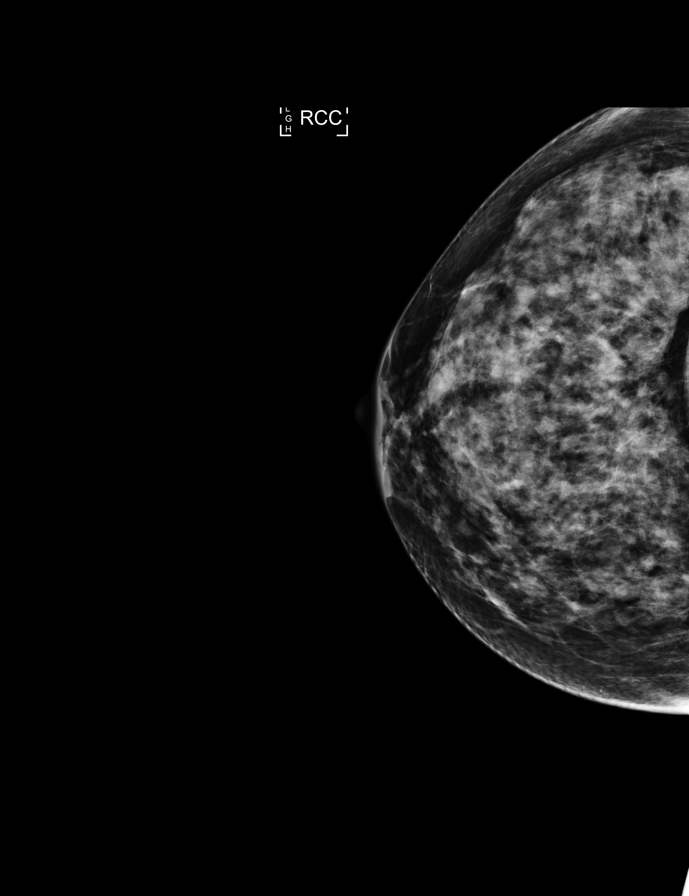

[R CC synth-2D]
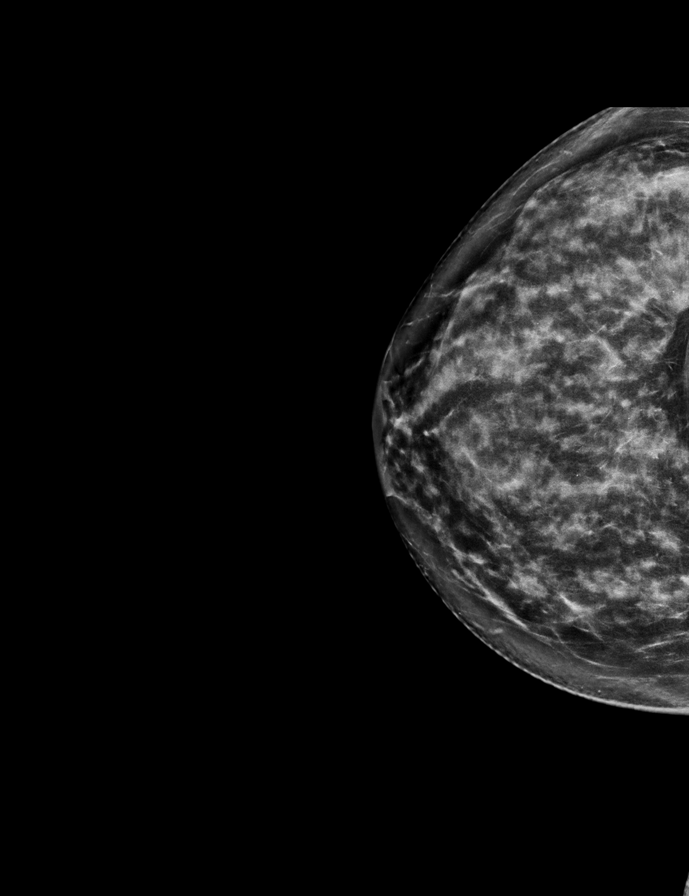

[8 of 28 positions shown; findings below may reference images not displayed]

ACR Breast Density Category c: The breast tissue is heterogeneously
dense, which may obscure small masses.
FINDINGS: Examination demonstrates mild disruption of the normal
fibroglandular architecture over the upper right breast, but no
definite true distortion as findings are unchanged. Findings are not
significantly changed from 0949 and no focal abnormality seen in
this area on 2 previous ultrasounds. Remainder of the exam is
unchanged.

Mammographic images were processed with CAD.
IMPRESSION: No evidence of true distortion over the upper right breast.

RECOMMENDATION:
As area precaution, recommend 1 additional followup diagnostic right
breast mammogram in 1 year at the time of patient's annual bilateral
mammogram in December 2017 to document 2 years of stability.

I have discussed the findings and recommendations with the patient.
Results were also provided in writing at the conclusion of the
visit. If applicable, a reminder letter will be sent to the patient
regarding the next appointment.

BI-RADS CATEGORY  3: Probably benign.

## 2017-11-08 ENCOUNTER — Encounter: Payer: Self-pay | Admitting: Family Medicine

## 2017-11-08 ENCOUNTER — Other Ambulatory Visit: Payer: Self-pay

## 2017-11-08 ENCOUNTER — Ambulatory Visit (INDEPENDENT_AMBULATORY_CARE_PROVIDER_SITE_OTHER): Payer: BLUE CROSS/BLUE SHIELD | Admitting: Family Medicine

## 2017-11-08 VITALS — BP 122/78 | HR 54 | Temp 97.8°F | Resp 15 | Ht 64.0 in | Wt 143.6 lb

## 2017-11-08 DIAGNOSIS — I10 Essential (primary) hypertension: Secondary | ICD-10-CM | POA: Diagnosis not present

## 2017-11-08 DIAGNOSIS — E039 Hypothyroidism, unspecified: Secondary | ICD-10-CM

## 2017-11-08 DIAGNOSIS — E785 Hyperlipidemia, unspecified: Secondary | ICD-10-CM | POA: Diagnosis not present

## 2017-11-08 LAB — CBC WITH DIFFERENTIAL/PLATELET
BASOS ABS: 0.1 10*3/uL (ref 0.0–0.1)
BASOS PCT: 2.1 % (ref 0.0–3.0)
Eosinophils Absolute: 0.1 10*3/uL (ref 0.0–0.7)
Eosinophils Relative: 3.6 % (ref 0.0–5.0)
HEMATOCRIT: 43.8 % (ref 36.0–46.0)
HEMOGLOBIN: 14.9 g/dL (ref 12.0–15.0)
LYMPHS PCT: 34.8 % (ref 12.0–46.0)
Lymphs Abs: 1.3 10*3/uL (ref 0.7–4.0)
MCHC: 34 g/dL (ref 30.0–36.0)
MCV: 96.6 fl (ref 78.0–100.0)
Monocytes Absolute: 0.4 10*3/uL (ref 0.1–1.0)
Monocytes Relative: 9.9 % (ref 3.0–12.0)
Neutro Abs: 1.8 10*3/uL (ref 1.4–7.7)
Neutrophils Relative %: 49.6 % (ref 43.0–77.0)
Platelets: 201 10*3/uL (ref 150.0–400.0)
RBC: 4.53 Mil/uL (ref 3.87–5.11)
RDW: 13.6 % (ref 11.5–15.5)
WBC: 3.7 10*3/uL — AB (ref 4.0–10.5)

## 2017-11-08 LAB — LIPID PANEL
CHOL/HDL RATIO: 3
Cholesterol: 228 mg/dL — ABNORMAL HIGH (ref 0–200)
HDL: 79.3 mg/dL (ref >39.00)
LDL Cholesterol: 132 mg/dL — ABNORMAL HIGH (ref 0–99)
NONHDL: 148.49
Triglycerides: 80 mg/dL (ref 0.0–149.0)
VLDL: 16 mg/dL (ref 0.0–40.0)

## 2017-11-08 LAB — HEPATIC FUNCTION PANEL
ALBUMIN: 4.2 g/dL (ref 3.5–5.2)
ALT: 15 U/L (ref 0–35)
AST: 17 U/L (ref 0–37)
Alkaline Phosphatase: 63 U/L (ref 39–117)
BILIRUBIN TOTAL: 0.7 mg/dL (ref 0.2–1.2)
Bilirubin, Direct: 0.1 mg/dL (ref 0.0–0.3)
Total Protein: 6.9 g/dL (ref 6.0–8.3)

## 2017-11-08 LAB — BASIC METABOLIC PANEL
BUN: 17 mg/dL (ref 6–23)
CALCIUM: 11.1 mg/dL — AB (ref 8.4–10.5)
CO2: 28 meq/L (ref 19–32)
CREATININE: 1.03 mg/dL (ref 0.40–1.20)
Chloride: 106 mEq/L (ref 96–112)
GFR: 58.51 mL/min — ABNORMAL LOW (ref >60.00)
Glucose, Bld: 92 mg/dL (ref 70–99)
Potassium: 5 mEq/L (ref 3.5–5.1)
Sodium: 138 mEq/L (ref 135–145)

## 2017-11-08 LAB — TSH: TSH: 4.65 u[IU]/mL — ABNORMAL HIGH (ref 0.35–4.50)

## 2017-11-08 LAB — T4, FREE: Free T4: 0.86 ng/dL (ref 0.60–1.60)

## 2017-11-08 NOTE — Progress Notes (Signed)
   Subjective:    Patient ID: Mariah Moss, female    DOB: 12-15-1960, 57 y.o.   MRN: [id number]  HPI HTN- chronic problem, on Triamterene HCTZ daily.  No CP, SOB, HAs, visual changes, edema.  Hyperlipidemia- last visit LDL was up to 147.  Goal was to work on healthy diet and regular exercise rather than medication.  Pt admits to limited exercise but she has lost 4 lbs since last visit.  Hypothyroid- pt's TSH was elevated in December and she was started on Levothyroxine 50mcg daily.  Pt is no longer on the medication- 'i'm on a sabbatical from that'.  Denies fatigue, changes to skin, hair, nails.   Review of Systems For ROS see HPI     Objective:   Physical Exam  Constitutional: She is oriented to person, place, and time. She appears well-developed and well-nourished. No distress.  HENT:  Head: Normocephalic and atraumatic.  Eyes: Pupils are equal, round, and reactive to light. Conjunctivae and EOM are normal.  Neck: Normal range of motion. Neck supple. No thyromegaly present.  Cardiovascular: Normal rate, regular rhythm, normal heart sounds and intact distal pulses.  No murmur heard. Pulmonary/Chest: Effort normal and breath sounds normal. No respiratory distress.  Abdominal: Soft. She exhibits no distension. There is no tenderness.  Musculoskeletal: She exhibits no edema.  Lymphadenopathy:    She has no cervical adenopathy.  Neurological: She is alert and oriented to person, place, and time.  Skin: Skin is warm and dry.  Psychiatric: She has a normal mood and affect. Her behavior is normal.  Vitals reviewed.         Assessment & Plan:

## 2017-11-08 NOTE — Assessment & Plan Note (Signed)
Ongoing issue.  Pt is on levothyroxine 'sabbatical' per Dr Lucianne MussKumar.  Check labs for upcoming Endo appt.  Currently asymptomatic.  Will follow.

## 2017-11-08 NOTE — Patient Instructions (Signed)
Schedule your complete physical in 6 months We'll notify you of your lab results and make any changes if needed Keep up the good work on healthy diet and regular exercise- you look great! Call with any questions or concerns Have a great summer!!! 

## 2017-11-08 NOTE — Assessment & Plan Note (Signed)
Noted on last labs.  Pt has lost 4 lbs since last visit but admits to limited exercise.  Check labs and start meds prn.

## 2017-11-08 NOTE — Assessment & Plan Note (Signed)
Chronic problem.  Well controlled.  Asymptomatic.  Check labs.  No anticipated med changes.  Will follow. 

## 2017-11-09 ENCOUNTER — Encounter: Payer: Self-pay | Admitting: General Practice

## 2017-11-18 ENCOUNTER — Telehealth: Payer: Self-pay | Admitting: Endocrinology

## 2017-11-18 ENCOUNTER — Other Ambulatory Visit: Payer: Self-pay | Admitting: Endocrinology

## 2017-11-18 MED ORDER — RALOXIFENE HCL 60 MG PO TABS
60.0000 mg | ORAL_TABLET | Freq: Every day | ORAL | 1 refills | Status: DC
Start: 2017-11-18 — End: 2018-01-25

## 2017-11-18 NOTE — Telephone Encounter (Signed)
FYI

## 2017-11-18 NOTE — Telephone Encounter (Signed)
Patient is returning your call stated that she will take the Evista.

## 2017-11-18 NOTE — Telephone Encounter (Signed)
Prescription has been sent.

## 2017-12-09 ENCOUNTER — Other Ambulatory Visit (INDEPENDENT_AMBULATORY_CARE_PROVIDER_SITE_OTHER): Payer: BLUE CROSS/BLUE SHIELD

## 2017-12-09 DIAGNOSIS — E213 Hyperparathyroidism, unspecified: Secondary | ICD-10-CM

## 2017-12-09 DIAGNOSIS — E039 Hypothyroidism, unspecified: Secondary | ICD-10-CM | POA: Diagnosis not present

## 2017-12-09 DIAGNOSIS — E038 Other specified hypothyroidism: Secondary | ICD-10-CM

## 2017-12-09 LAB — BASIC METABOLIC PANEL
BUN: 23 mg/dL (ref 6–23)
CALCIUM: 10.3 mg/dL (ref 8.4–10.5)
CO2: 29 meq/L (ref 19–32)
CREATININE: 0.96 mg/dL (ref 0.40–1.20)
Chloride: 105 mEq/L (ref 96–112)
GFR: 63.44 mL/min (ref >60.00)
GLUCOSE: 93 mg/dL (ref 70–99)
Potassium: 4.3 mEq/L (ref 3.5–5.1)
Sodium: 138 mEq/L (ref 135–145)

## 2017-12-09 LAB — T4, FREE: Free T4: 0.84 ng/dL (ref 0.60–1.60)

## 2017-12-09 LAB — TSH: TSH: 2.92 u[IU]/mL (ref 0.35–4.50)

## 2017-12-11 NOTE — Progress Notes (Signed)
Patient ID: Mariah Moss, female   DOB: 1960-09-20, 57 y.o.   MRN: [id number]            Chief complaint: High calcium and thyroid follow-up   Referring physician: Beverely Low  History of Present Illness:  She was seen in initial consultation for hypercalcemia in 11/2016   Review of records show that she has had a high calcium since 2013 and this has been variable  Even though her calcium had been above 11 since about 10/2016 it is now surprisingly normal Her HCTZ was reduced to half a tablet in 2018   She was recommended Evista for her osteopenia but she started this only about 3 weeks ago in late June 2019 No side effects with this  She has been monitored about twice a year for her hypercalcemia   Lab Results  Component Value Date   CALCIUM 10.3 12/09/2017   CALCIUM 11.1 (H) 11/08/2017   CALCIUM 11.3 (H) 04/26/2017   CALCIUM 11.1 (H) 11/29/2016   CALCIUM 11.3 (H) 10/27/2016   CALCIUM 10.9 (H) 12/15/2015   CALCIUM 10.3 06/16/2015   CALCIUM 10.8 (H) 12/09/2014   CALCIUM 10.9 (H) 12/06/2013    The hypercalcemia is not associated with any pathologic fractures, renal insufficiency, Kidney stones   Menopause at occurred at about age 64   Prior serologic and radiologic studies have included:  Lab Results  Component Value Date   PTH 120 (H) 11/09/2016   CALCIUM 10.3 12/09/2017      25 (OH) Vitamin D level Has been low normal or mildly low in 2017   Bone density done on 12/21/16 showed:   Lumbar spine L1-L4 (L3) Femoral neck (FN) 33% distal R radius  T-score -0.8 RFN: -1.8 LFN: -1.7 -1.5    THYROID history: See review of systems   Allergies as of 12/12/2017   No Known Allergies     Medication List        Accurate as of 12/12/17  8:17 AM. Always use your most recent med list.          citalopram 20 MG tablet Commonly known as:  CELEXA TAKE 1 TABLET BY MOUTH EVERY DAY   raloxifene 60 MG tablet Commonly known as:  EVISTA Take 1 tablet (60 mg total) by  mouth daily.   triamterene-hydrochlorothiazide 37.5-25 MG tablet Commonly known as:  MAXZIDE-25 TAKE 1 TABLET BY MOUTH EVERY DAY       Allergies: No Known Allergies  Past Medical History:  Diagnosis Date  . Heart murmur   . Hypertension     Past Surgical History:  Procedure Laterality Date  . BUNIONECTOMY  2000    Family History  Problem Relation Age of Onset  . Heart disease Mother   . Stroke Mother   . Hypertension Mother   . Cancer Father   . Diabetes Father     Social History:  reports that she has never smoked. She has never used smokeless tobacco. She reports that she drinks alcohol. She reports that she does not use drugs.  Review of Systems  Long-standing history of hypertension, treated with Maxzide 25 mg for several years and now taking half a tablet because of hypercalcemia  BP Readings from Last 3 Encounters:  12/12/17 112/74  11/08/17 122/78  07/11/17 129/82    Subclinical hypothyroidism:  Patient has been followed for abnormal TSH by PCP She does not report any unusual fatigue, sleepiness or lethargy  She also was felt to have a goiter on her initial  exam in 7/18 but ultrasound showed only heterogenous thyroid enlargement and no nodules  In 12/18 at her annual exam she was recommended starting 50 g of levothyroxine by her PCP However she does not feel any different with starting this She has been off her levothyroxine supplement since 05/2017 She feels fairly good recently Although her TSH was mildly increased last month it is back to normal    Lab Results  Component Value Date   TSH 2.92 12/09/2017   TSH 4.65 (H) 11/08/2017   TSH 2.80 05/30/2017   FREET4 0.84 12/09/2017   FREET4 0.86 11/08/2017   FREET4 0.81 11/29/2016    Taking long-term Celexa for anxiety   EXAM:  BP 112/74 (BP Location: Left Arm, Patient Position: Sitting, Cuff Size: Normal)   Pulse (!) 56   Ht 5\' 4"  (1.626 m)   Wt 147 lb 3.2 oz (66.8 kg)   LMP 12/14/2011    SpO2 98%   BMI 25.27 kg/m    Her thyroid is enlarged on the on the right side, 2-2 5 times normal, relatively firm and irregular surface without distinct nodules Left side not palpable  Assessment/Plan:   HYPERCALCEMIA: She has had mild chronic hypercalcemia for several years She has primary hyperparathyroidism based on her high PTH level  It is not clear if her osteopenia is related to hyperparathyroidism or menopause since no prior bone density available comparison  She is tolerating Evista so far and agrees to continue this long-term for fracture risk reduction, this may or may not affect her bone density but will improve bone architecture  THYROID enlargement and inconsistent increase in TSH: Likely she has Hashimoto thyroiditis She still has a significant thyroid enlargement on the right side  This is related to her autoimmune thyroid disease Currently TSH is normal and she will be followed annually for this, discussed possible hypothyroid symptoms   Reather Littlerjay Ilse Billman 12/12/2017, 8:17 AM

## 2017-12-12 ENCOUNTER — Ambulatory Visit (INDEPENDENT_AMBULATORY_CARE_PROVIDER_SITE_OTHER): Payer: BLUE CROSS/BLUE SHIELD | Admitting: Endocrinology

## 2017-12-12 ENCOUNTER — Encounter: Payer: Self-pay | Admitting: Endocrinology

## 2017-12-12 VITALS — BP 112/74 | HR 56 | Ht 64.0 in | Wt 147.2 lb

## 2017-12-12 DIAGNOSIS — E063 Autoimmune thyroiditis: Secondary | ICD-10-CM | POA: Diagnosis not present

## 2017-12-12 DIAGNOSIS — E213 Hyperparathyroidism, unspecified: Secondary | ICD-10-CM

## 2018-01-13 ENCOUNTER — Other Ambulatory Visit: Payer: Self-pay | Admitting: Family Medicine

## 2018-01-25 ENCOUNTER — Other Ambulatory Visit: Payer: Self-pay | Admitting: Endocrinology

## 2018-02-24 ENCOUNTER — Other Ambulatory Visit: Payer: Self-pay | Admitting: Family Medicine

## 2018-03-28 ENCOUNTER — Other Ambulatory Visit: Payer: Self-pay | Admitting: Endocrinology

## 2018-04-13 ENCOUNTER — Other Ambulatory Visit: Payer: Self-pay | Admitting: Family Medicine

## 2018-05-08 ENCOUNTER — Other Ambulatory Visit: Payer: Self-pay | Admitting: Family Medicine

## 2018-05-08 DIAGNOSIS — N6489 Other specified disorders of breast: Secondary | ICD-10-CM

## 2018-05-15 ENCOUNTER — Ambulatory Visit
Admission: RE | Admit: 2018-05-15 | Discharge: 2018-05-15 | Disposition: A | Discharge status: Home / Self Care | Payer: BLUE CROSS/BLUE SHIELD | Source: Ambulatory Visit | Attending: Family Medicine | Admitting: Family Medicine

## 2018-05-15 DIAGNOSIS — R922 Inconclusive mammogram: Secondary | ICD-10-CM | POA: Diagnosis not present

## 2018-05-15 DIAGNOSIS — N6489 Other specified disorders of breast: Secondary | ICD-10-CM

## 2018-05-16 ENCOUNTER — Encounter: Payer: Self-pay | Admitting: General Practice

## 2018-05-19 ENCOUNTER — Encounter: Payer: BLUE CROSS/BLUE SHIELD | Admitting: Family Medicine

## 2018-05-28 ENCOUNTER — Other Ambulatory Visit: Payer: Self-pay | Admitting: Endocrinology

## 2018-06-01 ENCOUNTER — Encounter: Payer: Self-pay | Admitting: Family Medicine

## 2018-06-01 ENCOUNTER — Ambulatory Visit (INDEPENDENT_AMBULATORY_CARE_PROVIDER_SITE_OTHER): Payer: BLUE CROSS/BLUE SHIELD | Admitting: Family Medicine

## 2018-06-01 ENCOUNTER — Other Ambulatory Visit: Payer: Self-pay

## 2018-06-01 VITALS — BP 121/81 | HR 55 | Temp 98.1°F | Resp 16 | Ht 64.0 in | Wt 139.5 lb

## 2018-06-01 DIAGNOSIS — Z23 Encounter for immunization: Secondary | ICD-10-CM | POA: Diagnosis not present

## 2018-06-01 DIAGNOSIS — Z0000 Encounter for general adult medical examination without abnormal findings: Secondary | ICD-10-CM | POA: Diagnosis not present

## 2018-06-01 DIAGNOSIS — E213 Hyperparathyroidism, unspecified: Secondary | ICD-10-CM

## 2018-06-01 DIAGNOSIS — I10 Essential (primary) hypertension: Secondary | ICD-10-CM | POA: Diagnosis not present

## 2018-06-01 LAB — CBC WITH DIFFERENTIAL/PLATELET
BASOS ABS: 0.1 10*3/uL (ref 0.0–0.1)
Basophils Relative: 1.3 % (ref 0.0–3.0)
Eosinophils Absolute: 0.1 10*3/uL (ref 0.0–0.7)
Eosinophils Relative: 2.8 % (ref 0.0–5.0)
HCT: 43.5 % (ref 36.0–46.0)
Hemoglobin: 14.6 g/dL (ref 12.0–15.0)
Lymphocytes Relative: 37.4 % (ref 12.0–46.0)
Lymphs Abs: 2 10*3/uL (ref 0.7–4.0)
MCHC: 33.5 g/dL (ref 30.0–36.0)
MCV: 97.2 fl (ref 78.0–100.0)
Monocytes Absolute: 0.6 10*3/uL (ref 0.1–1.0)
Monocytes Relative: 10.6 % (ref 3.0–12.0)
Neutro Abs: 2.6 10*3/uL (ref 1.4–7.7)
Neutrophils Relative %: 47.9 % (ref 43.0–77.0)
Platelets: 205 10*3/uL (ref 150.0–400.0)
RBC: 4.48 Mil/uL (ref 3.87–5.11)
RDW: 14.2 % (ref 11.5–15.5)
WBC: 5.4 10*3/uL (ref 4.0–10.5)

## 2018-06-01 LAB — BASIC METABOLIC PANEL
BUN: 22 mg/dL (ref 6–23)
CO2: 27 meq/L (ref 19–32)
Calcium: 10.8 mg/dL — ABNORMAL HIGH (ref 8.4–10.5)
Chloride: 107 mEq/L (ref 96–112)
Creatinine, Ser: 0.98 mg/dL (ref 0.40–1.20)
GFR: 61.85 mL/min (ref >60.00)
Glucose, Bld: 84 mg/dL (ref 70–99)
POTASSIUM: 3.7 meq/L (ref 3.5–5.1)
Sodium: 139 mEq/L (ref 135–145)

## 2018-06-01 LAB — LIPID PANEL
CHOL/HDL RATIO: 3
Cholesterol: 188 mg/dL (ref 0–200)
HDL: 70.1 mg/dL (ref >39.00)
LDL Cholesterol: 100 mg/dL — ABNORMAL HIGH (ref 0–99)
NONHDL: 118.16
Triglycerides: 91 mg/dL (ref 0.0–149.0)
VLDL: 18.2 mg/dL (ref 0.0–40.0)

## 2018-06-01 LAB — HEPATIC FUNCTION PANEL
ALK PHOS: 65 U/L (ref 39–117)
ALT: 17 U/L (ref 0–35)
AST: 19 U/L (ref 0–37)
Albumin: 3.9 g/dL (ref 3.5–5.2)
BILIRUBIN DIRECT: 0.1 mg/dL (ref 0.0–0.3)
BILIRUBIN TOTAL: 0.4 mg/dL (ref 0.2–1.2)
Total Protein: 6.7 g/dL (ref 6.0–8.3)

## 2018-06-01 LAB — VITAMIN D 25 HYDROXY (VIT D DEFICIENCY, FRACTURES): VITD: 24.99 ng/mL — ABNORMAL LOW (ref 30.00–100.00)

## 2018-06-01 LAB — TSH: TSH: 2.79 u[IU]/mL (ref 0.35–4.50)

## 2018-06-01 NOTE — Patient Instructions (Signed)
Follow up in 1 month to recheck BP We'll notify you of your lab results and make any changes if needed STOP the Triamterene HCTZ daily Keep up the good work on healthy diet and regular exercise- you look great!!! Call with any questions or concerns Happy New Year!!

## 2018-06-01 NOTE — Assessment & Plan Note (Signed)
Chronic problem.  Pt is hoping to stop BP meds given her recent normal BPs.  Stop meds and monitor for BP control.

## 2018-06-01 NOTE — Progress Notes (Signed)
   Subjective:    Patient ID: Mariah Moss, female    DOB: 1960/05/31, 58 y.o.   MRN: [id number]  HPI CPE- UTD on pap, mammo, colonoscopy.  Will get flu shot today.     Review of Systems Patient reports no vision/ hearing changes, adenopathy,fever, weight change,  persistant/recurrent hoarseness , swallowing issues, chest pain, palpitations, edema, persistant/recurrent cough, hemoptysis, dyspnea (rest/exertional/paroxysmal nocturnal), gastrointestinal bleeding (melena, rectal bleeding), abdominal pain, significant heartburn, bowel changes, GU symptoms (dysuria, hematuria, incontinence), Gyn symptoms (abnormal  bleeding, pain),  syncope, focal weakness, memory loss, numbness & tingling, skin/hair/nail changes, abnormal bruising or bleeding, anxiety, or depression.     Objective:   Physical Exam General Appearance:    Alert, cooperative, no distress, appears stated age  Head:    Normocephalic, without obvious abnormality, atraumatic  Eyes:    PERRL, conjunctiva/corneas clear, EOM's intact, fundi    benign, both eyes  Ears:    Normal TM's and external ear canals, both ears  Nose:   Nares normal, septum midline, mucosa normal, no drainage    or sinus tenderness  Throat:   Lips, mucosa, and tongue normal; teeth and gums normal  Neck:   Supple, symmetrical, trachea midline, no adenopathy;    Thyroid: no enlargement/tenderness/nodules  Back:     Symmetric, no curvature, ROM normal, no CVA tenderness  Lungs:     Clear to auscultation bilaterally, respirations unlabored  Chest Wall:    No tenderness or deformity   Heart:    Regular rate and rhythm, S1 and S2 normal, no murmur, rub   or gallop  Breast Exam:    Deferred to GYN  Abdomen:     Soft, non-tender, bowel sounds active all four quadrants,    no masses, no organomegaly  Genitalia:    Deferred to GYN  Rectal:    Extremities:   Extremities normal, atraumatic, no cyanosis or edema  Pulses:   2+ and symmetric all extremities  Skin:    Skin color, texture, turgor normal, no rashes or lesions  Lymph nodes:   Cervical, supraclavicular, and axillary nodes normal  Neurologic:   CNII-XII intact, normal strength, sensation and reflexes    throughout          Assessment & Plan:

## 2018-06-01 NOTE — Assessment & Plan Note (Signed)
Pt's PE WNL.  UTD on GYN, colonoscopy, Tdap.  Flu shot given.  Check labs.  Anticipatory guidance provided.

## 2018-06-01 NOTE — Assessment & Plan Note (Signed)
Check Vit D level 

## 2018-06-02 ENCOUNTER — Other Ambulatory Visit: Payer: Self-pay | Admitting: General Practice

## 2018-06-02 MED ORDER — VITAMIN D (ERGOCALCIFEROL) 1.25 MG (50000 UNIT) PO CAPS: 50000.0000 [IU] | ORAL_CAPSULE | ORAL | 0 refills | Status: DC

## 2018-06-26 ENCOUNTER — Other Ambulatory Visit: Payer: Self-pay | Admitting: Endocrinology

## 2018-07-03 ENCOUNTER — Ambulatory Visit: Payer: BLUE CROSS/BLUE SHIELD | Admitting: Family Medicine

## 2018-07-04 ENCOUNTER — Ambulatory Visit (INDEPENDENT_AMBULATORY_CARE_PROVIDER_SITE_OTHER): Payer: BLUE CROSS/BLUE SHIELD | Admitting: Family Medicine

## 2018-07-04 ENCOUNTER — Other Ambulatory Visit: Payer: Self-pay

## 2018-07-04 ENCOUNTER — Encounter: Payer: Self-pay | Admitting: Family Medicine

## 2018-07-04 VITALS — BP 118/76 | HR 56 | Temp 98.1°F | Resp 16 | Ht 64.0 in | Wt 140.2 lb

## 2018-07-04 DIAGNOSIS — I10 Essential (primary) hypertension: Secondary | ICD-10-CM | POA: Diagnosis not present

## 2018-07-04 DIAGNOSIS — F419 Anxiety disorder, unspecified: Secondary | ICD-10-CM | POA: Diagnosis not present

## 2018-07-04 NOTE — Progress Notes (Signed)
   Subjective:    Patient ID: Mariah DalesKelly Griffin Moss, female    DOB: 10-25-1960, 58 y.o.   MRN: [id number]  HPI HTN- pt stopped BP meds at last visit.  Today's reading is 1 month off all meds.  Looks great.  No CP, SOB, HAs, edema.  Anxiety- pt would like to wean off this if possible.  Feels like she's in a good place with anxiety, denies depression.   Review of Systems For ROS see HPI     Objective:   Physical Exam Vitals signs reviewed.  Constitutional:      General: She is not in acute distress.    Appearance: She is well-developed.  HENT:     Head: Normocephalic and atraumatic.  Eyes:     Conjunctiva/sclera: Conjunctivae normal.     Pupils: Pupils are equal, round, and reactive to light.  Neck:     Musculoskeletal: Normal range of motion and neck supple.     Thyroid: No thyromegaly.  Cardiovascular:     Rate and Rhythm: Normal rate and regular rhythm.     Heart sounds: Normal heart sounds. No murmur.  Pulmonary:     Effort: Pulmonary effort is normal. No respiratory distress.     Breath sounds: Normal breath sounds.  Abdominal:     General: There is no distension.     Palpations: Abdomen is soft.     Tenderness: There is no abdominal tenderness.  Lymphadenopathy:     Cervical: No cervical adenopathy.  Skin:    General: Skin is warm and dry.  Neurological:     Mental Status: She is alert and oriented to person, place, and time.  Psychiatric:        Behavior: Behavior normal.           Assessment & Plan:

## 2018-07-04 NOTE — Patient Instructions (Signed)
Schedule your complete physical for next January Decrease the Celexa to 1/2 tab every other day- alternating w/ a full tab- x1 month THEN decrease to 1/2 tab daily x1 month IF those transitions go well, you are then able to stop.  If at any time, you feel that the weaning is not working, hold or go up to the previous dose and let me know Call with any questions or concerns Happy Valentine's Day!!!

## 2018-07-04 NOTE — Assessment & Plan Note (Signed)
Pt is doing well and would like to wean Celexa.  Weaning protocol outlined for pt.  Will follow.

## 2018-07-04 NOTE — Assessment & Plan Note (Signed)
Chronic problem.  Now resolved off all BP meds.  Will continue to follow.

## 2018-07-09 ENCOUNTER — Other Ambulatory Visit: Payer: Self-pay | Admitting: Family Medicine

## 2018-07-24 ENCOUNTER — Other Ambulatory Visit: Payer: Self-pay | Admitting: Endocrinology

## 2018-08-20 ENCOUNTER — Other Ambulatory Visit: Payer: Self-pay | Admitting: Family Medicine

## 2018-08-26 ENCOUNTER — Other Ambulatory Visit: Payer: Self-pay | Admitting: Endocrinology

## 2018-08-30 ENCOUNTER — Other Ambulatory Visit: Payer: Self-pay

## 2018-08-30 ENCOUNTER — Encounter: Payer: Self-pay | Admitting: Family Medicine

## 2018-08-30 ENCOUNTER — Ambulatory Visit (INDEPENDENT_AMBULATORY_CARE_PROVIDER_SITE_OTHER): Payer: BLUE CROSS/BLUE SHIELD | Admitting: Family Medicine

## 2018-08-30 VITALS — BP 137/83 | Wt 138.0 lb

## 2018-08-30 DIAGNOSIS — L237 Allergic contact dermatitis due to plants, except food: Secondary | ICD-10-CM

## 2018-08-30 MED ORDER — TRIAMCINOLONE ACETONIDE 0.1 % EX OINT: 1.0000 "application " | TOPICAL_OINTMENT | Freq: Two times a day (BID) | CUTANEOUS | 1 refills | Status: DC

## 2018-08-30 MED ORDER — PREDNISONE 10 MG PO TABS: ORAL_TABLET | ORAL | 0 refills | Status: DC

## 2018-08-30 NOTE — Progress Notes (Signed)
   Virtual Visit via Video   I connected with Mariah Moss on 08/30/18 at  9:30 AM EDT by a video enabled telemedicine application and verified that I am speaking with the correct person using two identifiers. Location patient: Home Location provider: [facility], Office Persons participating in the virtual visit: Pt and myself  I discussed the limitations of evaluation and management by telemedicine and the availability of in person appointments. The patient expressed understanding and agreed to proceed.  Subjective:   HPI:  Poison Mariah Moss- pt reports this happens every spring despite wearing long sleeves and long pants.  Areas are spreading and they are very itchy.  Pt has areas on inner thighs, bilateral forearms, starting on torso.  Pt is able to socially distance w/o difficulty.  ROS: See pertinent positives and negatives per HPI.  Patient Active Problem List   Diagnosis Date Noted  . Hyperlipidemia 11/08/2017  . Hypothyroid 06/13/2017  . Hyperparathyroidism (HCC) 06/13/2017  . Benign paroxysmal positional vertigo 01/22/2014  . Hypercalcemia 10/29/2011  . General medical examination 10/14/2011  . HTN (hypertension) 10/14/2011  . Anxiety 10/14/2011    Social History   Tobacco Use  . Smoking status: Never Smoker  . Smokeless tobacco: Never Used  Substance Use Topics  . Alcohol use: Yes    Comment: occasionally    Current Outpatient Medications:  .  citalopram (CELEXA) 20 MG tablet, TAKE 1 TABLET BY MOUTH EVERY DAY, Disp: 90 tablet, Rfl: 0 .  raloxifene (EVISTA) 60 MG tablet, TAKE 1 TABLET BY MOUTH EVERY DAY, Disp: 30 tablet, Rfl: 1 .  Vitamin D, Ergocalciferol, (DRISDOL) 1.25 MG (50000 UT) CAPS capsule, TAKE 1 CAPSULE (50,000 UNITS TOTAL) BY MOUTH EVERY 7 (SEVEN) DAYS., Disp: 12 capsule, Rfl: 0  No Known Allergies  Objective:   BP 137/83 (BP Location: Left Arm, Patient Position: Sitting)   Wt 138 lb (62.6 kg)   LMP 12/14/2011   BMI 23.69 kg/m  AAOx3,  NAD NCAT, EOMI No obvious CN deficits Coloring WNL Rash consistent w/ plant dermatitis on L lateral thigh, forearms bilaterally Pt is able to speak clearly, coherently without shortness of breath or increased work of breathing.  Thought process is linear.  Mood is appropriate.   Assessment and Plan:   Plant dermatitis- new.  Pt has hx of similar.  Was asking for depomedrol injxn but rather than bring her in, will do Prednisone taper.  Reviewed the risk of immunosuppression and COVID infxn on prednisone and pt is aware.  She reports she is able to isolate from others.  Prescription for prednisone and triamcinolone sent.  Neena Rhymes, MD 08/30/2018

## 2018-08-30 NOTE — Progress Notes (Signed)
I have discussed the procedure for the virtual visit with the patient who has given consent to proceed with assessment and treatment.   Darlis Wragg R Othel Dicostanzo, RN     

## 2018-09-19 ENCOUNTER — Other Ambulatory Visit: Payer: Self-pay | Admitting: Endocrinology

## 2018-10-05 ENCOUNTER — Other Ambulatory Visit: Payer: Self-pay | Admitting: Family Medicine

## 2018-10-28 ENCOUNTER — Other Ambulatory Visit: Payer: Self-pay | Admitting: Endocrinology

## 2018-11-10 ENCOUNTER — Other Ambulatory Visit: Payer: Self-pay | Admitting: Family Medicine

## 2018-11-26 ENCOUNTER — Other Ambulatory Visit: Payer: Self-pay | Admitting: Endocrinology

## 2018-11-26 DIAGNOSIS — M85851 Other specified disorders of bone density and structure, right thigh: Secondary | ICD-10-CM

## 2018-11-26 DIAGNOSIS — M85852 Other specified disorders of bone density and structure, left thigh: Secondary | ICD-10-CM

## 2018-11-26 DIAGNOSIS — E213 Hyperparathyroidism, unspecified: Secondary | ICD-10-CM

## 2018-11-27 ENCOUNTER — Telehealth: Payer: Self-pay

## 2018-11-27 NOTE — Telephone Encounter (Signed)
Called pt per Dr. Ronnie Derby request and left a detailed voicemail requesting the pt to return our call and inform us where she would like her bone density scan to be scheduled as well as a good time frame as Dr. Dwyane Dee would prefer she has this completed by her appointment date next month.

## 2018-12-04 ENCOUNTER — Other Ambulatory Visit: Payer: Self-pay | Admitting: Endocrinology

## 2018-12-08 ENCOUNTER — Other Ambulatory Visit: Payer: Self-pay

## 2018-12-08 ENCOUNTER — Ambulatory Visit (INDEPENDENT_AMBULATORY_CARE_PROVIDER_SITE_OTHER)
Admission: RE | Admit: 2018-12-08 | Discharge: 2018-12-08 | Disposition: A | Discharge status: Home / Self Care | Payer: BC Managed Care – PPO | Source: Ambulatory Visit | Attending: Endocrinology | Admitting: Endocrinology

## 2018-12-08 DIAGNOSIS — M85851 Other specified disorders of bone density and structure, right thigh: Secondary | ICD-10-CM

## 2018-12-08 DIAGNOSIS — M85852 Other specified disorders of bone density and structure, left thigh: Secondary | ICD-10-CM

## 2018-12-08 DIAGNOSIS — E213 Hyperparathyroidism, unspecified: Secondary | ICD-10-CM | POA: Diagnosis not present

## 2018-12-10 ENCOUNTER — Encounter: Payer: Self-pay | Admitting: Endocrinology

## 2018-12-10 NOTE — Progress Notes (Signed)
Patient ID: Mariah Moss, female   DOB: February 29, 1961, 58 y.o.   MRN: [id number]            Chief complaint: Endocrinology follow-up   Referring physician: Birdie Riddle  History of Present Illness:  HYPERCALCEMIA  She was seen in initial consultation for hypercalcemia in 11/2016   Review of records show that she has had a high calcium since 2013 and this has been variable  Even though her calcium had been above 11 since about 10/2016 it is relatively better more recently  Her HCTZ was reduced to half a tablet in 2018 She has been asymptomatic with this, does have osteopenia but no history of kidney stones or fractures  Recent calcium pending, with PCP he was mildly increased earlier this year   Lab Results  Component Value Date   CALCIUM 10.8 (H) 06/01/2018   CALCIUM 10.3 12/09/2017   CALCIUM 11.1 (H) 11/08/2017   CALCIUM 11.3 (H) 04/26/2017   CALCIUM 11.1 (H) 11/29/2016   CALCIUM 11.3 (H) 10/27/2016   CALCIUM 10.9 (H) 12/15/2015   CALCIUM 10.3 06/16/2015   CALCIUM 10.8 (H) 12/09/2014    Lab Results  Component Value Date   PTH 120 (H) 11/09/2016   CALCIUM 10.8 (H) 06/01/2018      OSTEOPENIA:   Menopause at occurred at about age 50  She was recommended Evista for her osteopenia with baseline T score -1.8; she started this in late June 2019 No side effects with this such as hot flashes    Although her bone density is relatively stable it is relatively less in the radius   Lumbar spine L1-L4 (L3) Femoral neck (FN) 33% distal radius Ultra distal radius  T-score -0.8 RFN: -1.8 LFN: -1.9 -1.8 -2.5  Change in BMD from previous DXA test (%)  0.0%  -0.6%  -4.4% n/a    Bone density done on 12/21/16 showed:   Lumbar spine L1-L4 (L3) Femoral neck (FN) 33% distal R radius  T-score -0.8 RFN: -1.8 LFN: -1.7 -1.5    VITAMIN D deficiency:  25 (OH) Vitamin D level has previously been low She was given a prescription vitamin D for 3 months earlier this year but she is  not take any extra supplement, currently only taking 2000 units of vitamin D3  Lab Results  Component Value Date   VD25OH 24.99 (L) 06/01/2018   VD25OH 31.89 11/29/2016   VD25OH 31.01 12/15/2015   VD25OH 24.22 (L) 12/09/2014      THYROID history: See review of systems   Allergies as of 12/11/2018   No Known Allergies     Medication List       Accurate as of December 11, 2018 12:44 PM. If you have any questions, ask your nurse or doctor.        citalopram 20 MG tablet Commonly known as: CELEXA TAKE 1 TABLET BY MOUTH EVERY DAY   predniSONE 10 MG tablet Commonly known as: DELTASONE 3 tabs x3 days and then 2 tabs x3 days and then 1 tab x3 days.  Take w/ food.   raloxifene 60 MG tablet Commonly known as: EVISTA TAKE 1 TABLET BY MOUTH EVERY DAY   triamcinolone ointment 0.1 % Commonly known as: KENALOG Apply 1 application topically 2 (two) times daily.   Vitamin D (Ergocalciferol) 1.25 MG (50000 UT) Caps capsule Commonly known as: DRISDOL TAKE 1 CAPSULE (50,000 UNITS TOTAL) BY MOUTH EVERY 7 (SEVEN) DAYS.       Allergies: No Known Allergies  Past Medical History:  Diagnosis Date  . Heart murmur   . Hypertension     Past Surgical History:  Procedure Laterality Date  . BUNIONECTOMY  2000    Family History  Problem Relation Age of Onset  . Heart disease Mother   . Stroke Mother   . Hypertension Mother   . Cancer Father   . Diabetes Father     Social History:  reports that she has never smoked. She has never used smokeless tobacco. She reports current alcohol use. She reports that she does not use drugs.  Review of Systems  Long-standing history of hypertension, treated with Maxzide 25 mg for several years and now taking half a tablet because of hypercalcemia  BP Readings from Last 3 Encounters:  12/11/18 120/82  08/30/18 137/83  07/04/18 118/76    Subclinical hypothyroidism:  Patient has been followed for abnormal TSH by PCP She does not report  any unusual fatigue, sleepiness or lethargy  She also was felt to have a goiter on her initial exam in 7/18 but ultrasound showed only heterogenous thyroid enlargement and no nodules  In 12/18 at her annual exam she was recommended starting 50 g of levothyroxine by her PCP However she does not feel any different with starting this She has been off her levothyroxine supplement since 05/2017  No complaints of fatigue TSH last was normal   Lab Results  Component Value Date   TSH 2.79 06/01/2018   TSH 2.92 12/09/2017   TSH 4.65 (H) 11/08/2017   FREET4 0.84 12/09/2017   FREET4 0.86 11/08/2017   FREET4 0.81 11/29/2016    Taking long-term Celexa for anxiety   EXAM:  BP 120/82 (BP Location: Left Arm, Patient Position: Sitting, Cuff Size: Normal)   Pulse 60   Ht 5\' 5"  (1.651 m)   Wt 141 lb 6.4 oz (64.1 kg)   LMP 12/14/2011   SpO2 97%   BMI 23.53 kg/m    Right thyroid lobe is palpable, 2-2 5 times normal, relatively firm and irregular surface.  No distinct nodules felt  Left side not palpable No lymph nodes in the lateral neck  Assessment/Plan:   HYPERCALCEMIA: She has had mild chronic hypercalcemia for several years She has primary hyperparathyroidism based on her high PTH level  Labs to be rechecked today  OSTEOPENIA: She is tolerating Evista Although this is not going to increase her bone density discussed that this will reduce fracture risk Bone density at the hip is still showing osteopenia and only minimally changed on one side Not clear if there is any significance to the decrease in the bone density at the radius No height loss since last year  Vitamin D deficiency: She will likely need a higher dose of vitamin D for supplementation and will check levels today She does need adequate vitamin D supplementation because of her osteopenia  THYROID enlargement probably from Hashimoto's thyroiditis with no known thyroid nodule disease TSH will be  rechecked Subjectively she is doing well and her thyroid enlargement is about the same as last year  Total visit time for evaluation and management of multiple problems and counseling =25 minutes  There are no Patient Instructions on file for this visit.   Reather LittlerAjay Doneta Bayman 12/11/2018, 12:44 PM

## 2018-12-11 ENCOUNTER — Other Ambulatory Visit: Payer: Self-pay

## 2018-12-11 ENCOUNTER — Ambulatory Visit (INDEPENDENT_AMBULATORY_CARE_PROVIDER_SITE_OTHER): Payer: BC Managed Care – PPO | Admitting: Endocrinology

## 2018-12-11 VITALS — BP 120/82 | HR 60 | Ht 65.0 in | Wt 141.4 lb

## 2018-12-11 DIAGNOSIS — E559 Vitamin D deficiency, unspecified: Secondary | ICD-10-CM | POA: Diagnosis not present

## 2018-12-11 DIAGNOSIS — M85851 Other specified disorders of bone density and structure, right thigh: Secondary | ICD-10-CM

## 2018-12-11 DIAGNOSIS — E038 Other specified hypothyroidism: Secondary | ICD-10-CM

## 2018-12-11 DIAGNOSIS — E213 Hyperparathyroidism, unspecified: Secondary | ICD-10-CM

## 2018-12-11 DIAGNOSIS — M85852 Other specified disorders of bone density and structure, left thigh: Secondary | ICD-10-CM

## 2018-12-11 DIAGNOSIS — E039 Hypothyroidism, unspecified: Secondary | ICD-10-CM

## 2018-12-11 DIAGNOSIS — E063 Autoimmune thyroiditis: Secondary | ICD-10-CM

## 2018-12-11 LAB — T4, FREE: Free T4: 0.76 ng/dL (ref 0.60–1.60)

## 2018-12-11 LAB — BASIC METABOLIC PANEL
BUN: 24 mg/dL — ABNORMAL HIGH (ref 6–23)
CO2: 24 mEq/L (ref 19–32)
Calcium: 10.5 mg/dL (ref 8.4–10.5)
Chloride: 107 mEq/L (ref 96–112)
Creatinine, Ser: 1.17 mg/dL (ref 0.40–1.20)
GFR: 47.34 mL/min — ABNORMAL LOW (ref >60.00)
Glucose, Bld: 83 mg/dL (ref 70–99)
Potassium: 4.8 mEq/L (ref 3.5–5.1)
Sodium: 138 mEq/L (ref 135–145)

## 2018-12-11 LAB — VITAMIN D 25 HYDROXY (VIT D DEFICIENCY, FRACTURES): VITD: 36.1 ng/mL (ref 30.00–100.00)

## 2018-12-11 LAB — TSH: TSH: 3.54 u[IU]/mL (ref 0.35–4.50)

## 2018-12-28 ENCOUNTER — Other Ambulatory Visit: Payer: Self-pay | Admitting: Family Medicine

## 2018-12-31 ENCOUNTER — Other Ambulatory Visit: Payer: Self-pay | Admitting: Endocrinology

## 2019-01-09 ENCOUNTER — Encounter: Payer: Self-pay | Admitting: Family Medicine

## 2019-01-12 ENCOUNTER — Ambulatory Visit (INDEPENDENT_AMBULATORY_CARE_PROVIDER_SITE_OTHER): Payer: BC Managed Care – PPO | Admitting: Physician Assistant

## 2019-01-12 ENCOUNTER — Encounter: Payer: Self-pay | Admitting: Physician Assistant

## 2019-01-12 ENCOUNTER — Other Ambulatory Visit: Payer: Self-pay

## 2019-01-12 VITALS — BP 130/88 | HR 69 | Temp 98.1°F | Resp 16 | Ht 64.0 in | Wt 142.0 lb

## 2019-01-12 DIAGNOSIS — I10 Essential (primary) hypertension: Secondary | ICD-10-CM | POA: Diagnosis not present

## 2019-01-12 NOTE — Progress Notes (Signed)
Patient with history of hypertension (diet-controlled) presents to clinic today c/o some elevated BP readings noted over the past couple of weeks. Notes she has been keeping a low salt diet and keeping hydrated. Denies increased stressors. Patient denies chest pain, palpitations, lightheadedness, dizziness, vision changes or frequent headaches. Notes these readings have been elevated since she bought a new BP machine.   Past Medical History:  Diagnosis Date  . Heart murmur   . Hypertension     Current Outpatient Medications on File Prior to Visit  Medication Sig Dispense Refill  . citalopram (CELEXA) 20 MG tablet TAKE 1 TABLET BY MOUTH EVERY DAY 90 tablet 0  . raloxifene (EVISTA) 60 MG tablet TAKE 1 TABLET BY MOUTH EVERY DAY 90 tablet 1  . triamcinolone ointment (KENALOG) 0.1 % Apply 1 application topically 2 (two) times daily. 90 g 1  . Vitamin D, Ergocalciferol, (DRISDOL) 1.25 MG (50000 UT) CAPS capsule TAKE 1 CAPSULE (50,000 UNITS TOTAL) BY MOUTH EVERY 7 (SEVEN) DAYS. 12 capsule 0   No current facility-administered medications on file prior to visit.     No Known Allergies  Family History  Problem Relation Age of Onset  . Heart disease Mother   . Stroke Mother   . Hypertension Mother   . Cancer Father   . Diabetes Father     Social History   Socioeconomic History  . Marital status: Married    Spouse name: Not on file  . Number of children: Not on file  . Years of education: Not on file  . Highest education level: Not on file  Occupational History  . Not on file  Social Needs  . Financial resource strain: Not on file  . Food insecurity    Worry: Not on file    Inability: Not on file  . Transportation needs    Medical: Not on file    Non-medical: Not on file  Tobacco Use  . Smoking status: Never Smoker  . Smokeless tobacco: Never Used  Substance and Sexual Activity  . Alcohol use: Yes    Comment: occasionally  . Drug use: No  . Sexual activity: Not Currently   Lifestyle  . Physical activity    Days per week: Not on file    Minutes per session: Not on file  . Stress: Not on file  Relationships  . Social Musicianconnections    Talks on phone: Not on file    Gets together: Not on file    Attends religious service: Not on file    Active member of club or organization: Not on file    Attends meetings of clubs or organizations: Not on file    Relationship status: Not on file  Other Topics Concern  . Not on file  Social History Narrative  . Not on file   Review of Systems - See HPI.  All other ROS are negative.  BP 130/88   Pulse 69   Temp 98.1 F (36.7 C) (Skin)   Resp 16   Ht 5\' 4"  (1.626 m)   Wt 142 lb (64.4 kg)   LMP 12/14/2011   SpO2 99%   BMI 24.37 kg/m   Physical Exam Vitals signs reviewed.  Constitutional:      Appearance: Normal appearance.  HENT:     Head: Normocephalic and atraumatic.  Neck:     Musculoskeletal: Neck supple.  Cardiovascular:     Rate and Rhythm: Normal rate and regular rhythm.     Pulses: Normal pulses.  Heart sounds: Normal heart sounds.  Pulmonary:     Effort: Pulmonary effort is normal.     Breath sounds: Normal breath sounds.  Neurological:     General: No focal deficit present.     Mental Status: She is alert and oriented to person, place, and time.  Psychiatric:        Mood and Affect: Mood normal.    Recent Results (from the past 2160 hour(s))  VITAMIN D 25 Hydroxy (Vit-D Deficiency, Fractures)     Status: None   Collection Time: 12/11/18  8:59 AM  Result Value Ref Range   VITD 36.10 30.00 - 100.00 ng/mL  Basic metabolic panel     Status: Abnormal   Collection Time: 12/11/18  8:59 AM  Result Value Ref Range   Sodium 138 135 - 145 mEq/L   Potassium 4.8 3.5 - 5.1 mEq/L   Chloride 107 96 - 112 mEq/L   CO2 24 19 - 32 mEq/L   Glucose, Bld 83 70 - 99 mg/dL   BUN 24 (H) 6 - 23 mg/dL   Creatinine, Ser 1.17 0.40 - 1.20 mg/dL   Calcium 10.5 8.4 - 10.5 mg/dL   GFR 47.34 (L) >60.00 mL/min   T4, free     Status: None   Collection Time: 12/11/18  8:59 AM  Result Value Ref Range   Free T4 0.76 0.60 - 1.60 ng/dL    Comment: Specimens from patients who are undergoing biotin therapy and /or ingesting biotin supplements may contain high levels of biotin.  The higher biotin concentration in these specimens interferes with this Free T4 assay.  Specimens that contain high levels  of biotin may cause false high results for this Free T4 assay.  Please interpret results in light of the total clinical presentation of the patient.    TSH     Status: None   Collection Time: 12/11/18  8:59 AM  Result Value Ref Range   TSH 3.54 0.35 - 4.50 uIU/mL    Assessment/Plan: 1. Essential hypertension Mildly elevated today (DBP) but less than 140/90. Her home cuff is a bit higher in both arms compared to manual. Recommend she work hard on [employer]. Keep close check on BP  Readings at home (getting new cuff). If still getting numbers above 145/90 will need to restart a low-dose antihypertensive medication.    Leeanne Rio, PA-C

## 2019-01-12 NOTE — Patient Instructions (Signed)
BP improved on recheck and steady in both arms.  Would recommend you continue a check on BP once every day and record, making sure you have been resting for a while before hand (5-10 minutes).   Try to work really hard on dietary recommendations below. If BP still remaining elevated we will consider restarting a low dose medication.   DASH Eating Plan DASH stands for "Dietary Approaches to Stop Hypertension." The DASH eating plan is a healthy eating plan that has been shown to reduce high blood pressure (hypertension). It may also reduce your risk for type 2 diabetes, heart disease, and stroke. The DASH eating plan may also help with weight loss. What are tips for following this plan?  General guidelines  Avoid eating more than 2,300 mg (milligrams) of salt (sodium) a day. If you have hypertension, you may need to reduce your sodium intake to 1,500 mg a day.  Limit alcohol intake to no more than 1 drink a day for nonpregnant women and 2 drinks a day for men. One drink equals 12 oz of beer, 5 oz of wine, or 1 oz of hard liquor.  Work with your health care provider to maintain a healthy body weight or to lose weight. Ask what an ideal weight is for you.  Get at least 30 minutes of exercise that causes your heart to beat faster (aerobic exercise) most days of the week. Activities may include walking, swimming, or biking.  Work with your health care provider or diet and nutrition specialist (dietitian) to adjust your eating plan to your individual calorie needs. Reading food labels   Check food labels for the amount of sodium per serving. Choose foods with less than 5 percent of the Daily Value of sodium. Generally, foods with less than 300 mg of sodium per serving fit into this eating plan.  To find whole grains, look for the word "whole" as the first word in the ingredient list. Shopping  Buy products labeled as "low-sodium" or "no salt added."  Buy fresh foods. Avoid canned foods and  premade or frozen meals. Cooking  Avoid adding salt when cooking. Use salt-free seasonings or herbs instead of table salt or sea salt. Check with your health care provider or pharmacist before using salt substitutes.  Do not fry foods. Cook foods using healthy methods such as baking, boiling, grilling, and broiling instead.  Cook with heart-healthy oils, such as olive, canola, soybean, or sunflower oil. Meal planning  Eat a balanced diet that includes: ? 5 or more servings of fruits and vegetables each day. At each meal, try to fill half of your plate with fruits and vegetables. ? Up to 6-8 servings of whole grains each day. ? Less than 6 oz of lean meat, poultry, or fish each day. A 3-oz serving of meat is about the same size as a deck of cards. One egg equals 1 oz. ? 2 servings of low-fat dairy each day. ? A serving of nuts, seeds, or beans 5 times each week. ? Heart-healthy fats. Healthy fats called Omega-3 fatty acids are found in foods such as flaxseeds and coldwater fish, like sardines, salmon, and mackerel.  Limit how much you eat of the following: ? Canned or prepackaged foods. ? Food that is high in trans fat, such as fried foods. ? Food that is high in saturated fat, such as fatty meat. ? Sweets, desserts, sugary drinks, and other foods with added sugar. ? Full-fat dairy products.  Do not salt foods before  eating.  Try to eat at least 2 vegetarian meals each week.  Eat more home-cooked food and less restaurant, buffet, and fast food.  When eating at a restaurant, ask that your food be prepared with less salt or no salt, if possible. What foods are recommended? The items listed may not be a complete list. Talk with your dietitian about what dietary choices are best for you. Grains Whole-grain or whole-wheat bread. Whole-grain or whole-wheat pasta. Brown rice. [place]. Bulgur. Whole-grain and low-sodium cereals. Pita bread. Low-fat, low-sodium crackers. Whole-wheat  flour tortillas. Vegetables Fresh or frozen vegetables (raw, steamed, roasted, or grilled). Low-sodium or reduced-sodium tomato and vegetable juice. Low-sodium or reduced-sodium tomato sauce and tomato paste. Low-sodium or reduced-sodium canned vegetables. Fruits All fresh, dried, or frozen fruit. Canned fruit in natural juice (without added sugar). Meat and other protein foods Skinless chicken or [place]. Ground chicken or [place]. Pork with fat trimmed off. Fish and seafood. Egg whites. Dried beans, peas, or lentils. Unsalted nuts, nut butters, and seeds. Unsalted canned beans. Lean cuts of beef with fat trimmed off. Low-sodium, lean deli meat. Dairy Low-fat (1%) or fat-free (skim) milk. Fat-free, low-fat, or reduced-fat cheeses. Nonfat, low-sodium ricotta or cottage cheese. Low-fat or nonfat yogurt. Low-fat, low-sodium cheese. Fats and oils Soft margarine without trans fats. Vegetable oil. Low-fat, reduced-fat, or light mayonnaise and salad dressings (reduced-sodium). Canola, safflower, olive, soybean, and sunflower oils. Avocado. Seasoning and other foods Herbs. Spices. Seasoning mixes without salt. Unsalted popcorn and pretzels. Fat-free sweets. What foods are not recommended? The items listed may not be a complete list. Talk with your dietitian about what dietary choices are best for you. Grains Baked goods made with fat, such as croissants, muffins, or some breads. Dry pasta or rice meal packs. Vegetables Creamed or fried vegetables. Vegetables in a cheese sauce. Regular canned vegetables (not low-sodium or reduced-sodium). Regular canned tomato sauce and paste (not low-sodium or reduced-sodium). Regular tomato and vegetable juice (not low-sodium or reduced-sodium). Angie Fava. Olives. Fruits Canned fruit in a light or heavy syrup. Fried fruit. Fruit in cream or butter sauce. Meat and other protein foods Fatty cuts of meat. Ribs. Fried meat. Berniece Salines. Sausage. Bologna and other processed lunch  meats. Salami. Fatback. Hotdogs. Bratwurst. Salted nuts and seeds. Canned beans with added salt. Canned or smoked fish. Whole eggs or egg yolks. Chicken or [place] with skin. Dairy Whole or 2% milk, cream, and half-and-half. Whole or full-fat cream cheese. Whole-fat or sweetened yogurt. Full-fat cheese. Nondairy creamers. Whipped toppings. Processed cheese and cheese spreads. Fats and oils Butter. Stick margarine. Lard. Shortening. Ghee. Bacon fat. Tropical oils, such as coconut, palm kernel, or palm oil. Seasoning and other foods Salted popcorn and pretzels. Onion salt, garlic salt, seasoned salt, table salt, and sea salt. Worcestershire sauce. Tartar sauce. Barbecue sauce. Teriyaki sauce. Soy sauce, including reduced-sodium. Steak sauce. Canned and packaged gravies. Fish sauce. Oyster sauce. Cocktail sauce. Horseradish that you find on the shelf. Ketchup. Mustard. Meat flavorings and tenderizers. Bouillon cubes. Hot sauce and Tabasco sauce. Premade or packaged marinades. Premade or packaged taco seasonings. Relishes. Regular salad dressings. Where to find more information:  National Heart, Lung, and [place]: https://wilson-eaton.com/  American Heart Association: www.heart.org Summary  The DASH eating plan is a healthy eating plan that has been shown to reduce high blood pressure (hypertension). It may also reduce your risk for type 2 diabetes, heart disease, and stroke.  With the DASH eating plan, you should limit salt (sodium) intake to 2,300 mg a  day. If you have hypertension, you may need to reduce your sodium intake to 1,500 mg a day.  When on the DASH eating plan, aim to eat more fresh fruits and vegetables, whole grains, lean proteins, low-fat dairy, and heart-healthy fats.  Work with your health care provider or diet and nutrition specialist (dietitian) to adjust your eating plan to your individual calorie needs. This information is not intended to replace advice given to you by your  health care provider. Make sure you discuss any questions you have with your health care provider. Document Released: 04/29/2011 Document Revised: 04/22/2017 Document Reviewed: 05/03/2016 Elsevier Patient Education  2020 [employer]

## 2019-03-30 ENCOUNTER — Other Ambulatory Visit: Payer: Self-pay | Admitting: Family Medicine

## 2019-04-18 ENCOUNTER — Other Ambulatory Visit: Payer: Self-pay | Admitting: Family Medicine

## 2019-04-18 DIAGNOSIS — Z1231 Encounter for screening mammogram for malignant neoplasm of breast: Secondary | ICD-10-CM

## 2019-05-02 DIAGNOSIS — Z01419 Encounter for gynecological examination (general) (routine) without abnormal findings: Secondary | ICD-10-CM | POA: Diagnosis not present

## 2019-05-02 DIAGNOSIS — N959 Unspecified menopausal and perimenopausal disorder: Secondary | ICD-10-CM | POA: Diagnosis not present

## 2019-05-02 DIAGNOSIS — Z79890 Hormone replacement therapy (postmenopausal): Secondary | ICD-10-CM | POA: Diagnosis not present

## 2019-05-02 DIAGNOSIS — Z780 Asymptomatic menopausal state: Secondary | ICD-10-CM | POA: Diagnosis not present

## 2019-05-02 DIAGNOSIS — Z130 Encounter for screening for diseases of the blood and blood-forming organs and certain disorders involving the immune mechanism: Secondary | ICD-10-CM | POA: Diagnosis not present

## 2019-06-05 ENCOUNTER — Other Ambulatory Visit: Payer: Self-pay

## 2019-06-06 ENCOUNTER — Ambulatory Visit (INDEPENDENT_AMBULATORY_CARE_PROVIDER_SITE_OTHER): Payer: BC Managed Care – PPO | Admitting: Family Medicine

## 2019-06-06 ENCOUNTER — Encounter: Payer: Self-pay | Admitting: Family Medicine

## 2019-06-06 VITALS — BP 140/90 | HR 79 | Temp 97.9°F | Resp 16 | Ht 64.0 in | Wt 150.2 lb

## 2019-06-06 DIAGNOSIS — Z23 Encounter for immunization: Secondary | ICD-10-CM | POA: Diagnosis not present

## 2019-06-06 DIAGNOSIS — E213 Hyperparathyroidism, unspecified: Secondary | ICD-10-CM | POA: Diagnosis not present

## 2019-06-06 DIAGNOSIS — Z0000 Encounter for general adult medical examination without abnormal findings: Secondary | ICD-10-CM

## 2019-06-06 DIAGNOSIS — I10 Essential (primary) hypertension: Secondary | ICD-10-CM | POA: Diagnosis not present

## 2019-06-06 MED ORDER — HYDROCHLOROTHIAZIDE 12.5 MG PO TABS: 12.5000 mg | ORAL_TABLET | Freq: Every day | ORAL | 3 refills | Status: DC

## 2019-06-06 NOTE — Progress Notes (Signed)
   Subjective:    Patient ID: Mariah Moss, female    DOB: [birth year], 59 y.o.   MRN: [id number]  HPI CPE- UTD on pap, mammo is scheduled.  UTD on colonoscopy.  UTD on Tdap.  Due for flu today.  Pt has gained 8 lbs.  HTN- chronic problem, pt was able to successfully come off BP meds last year.  At recent GYN exam BP was elevated and again today.  Asymptomatic.  Has gained 8 lbs.   Review of Systems Patient reports no vision/ hearing changes, adenopathy,fever, persistant/recurrent hoarseness , swallowing issues, chest pain, palpitations, edema, persistant/recurrent cough, hemoptysis, dyspnea (rest/exertional/paroxysmal nocturnal), gastrointestinal bleeding (melena, rectal bleeding), abdominal pain, significant heartburn, bowel changes, GU symptoms (dysuria, hematuria, incontinence), Gyn symptoms (abnormal  bleeding, pain),  syncope, focal weakness, memory loss, numbness & tingling, skin/hair/nail changes, abnormal bruising or bleeding, anxiety, or depression.   This visit occurred during the SARS-CoV-2 public health emergency.  Safety protocols were in place, including screening questions prior to the visit, additional usage of staff PPE, and extensive cleaning of exam room while observing appropriate contact time as indicated for disinfecting solutions.       Objective:   Physical Exam General Appearance:    Alert, cooperative, no distress, appears stated age  Head:    Normocephalic, without obvious abnormality, atraumatic  Eyes:    PERRL, conjunctiva/corneas clear, EOM's intact, fundi    benign, both eyes  Ears:    Normal TM's and external ear canals, both ears  Nose:   Deferred due to COVID  Throat:   Neck:   Supple, symmetrical, trachea midline, no adenopathy;    Thyroid: no enlargement/tenderness/nodules  Back:     Symmetric, no curvature, ROM normal, no CVA tenderness  Lungs:     Clear to auscultation bilaterally, respirations unlabored  Chest Wall:    No tenderness or  deformity   Heart:    Regular rate and rhythm, S1 and S2 normal, no murmur, rub   or gallop  Breast Exam:    Deferred to GYN  Abdomen:     Soft, non-tender, bowel sounds active all four quadrants,    no masses, no organomegaly  Genitalia:    Deferred to GYN  Rectal:    Extremities:   Extremities normal, atraumatic, no cyanosis or edema  Pulses:   2+ and symmetric all extremities  Skin:   Skin color, texture, turgor normal, no rashes or lesions  Lymph nodes:   Cervical, supraclavicular, and axillary nodes normal  Neurologic:   CNII-XII intact, normal strength, sensation and reflexes    throughout          Assessment & Plan:

## 2019-06-06 NOTE — Assessment & Plan Note (Signed)
Following w/ Dr Lucianne Muss.  Will check Vit D today

## 2019-06-06 NOTE — Patient Instructions (Signed)
Follow up in 1 month to recheck BP We'll notify you of your lab results and make any changes if needed Continue to work on healthy diet and regular exercise- you can do it! Have them send me a copy of your mammogram so I can update your chart START the HCTZ daily to improve blood pressure Call with any questions or concerns Stay Safe!!  Stay Healthy!!

## 2019-06-06 NOTE — Assessment & Plan Note (Signed)
Pt's PE WNL.  UTD on pap, colonoscopy.  Mammo scheduled.  Flu shot given.  Check labs.  Anticipatory guidance provided.

## 2019-06-06 NOTE — Assessment & Plan Note (Signed)
Deteriorated.  Pt's BP is again above goal.  Suspect this is due to weight gain and change in activity level over the past year.  Will restart low dose HCTZ and monitor for improvement.  Pt expressed understanding and is in agreement w/ plan.

## 2019-06-07 ENCOUNTER — Encounter: Payer: Self-pay | Admitting: General Practice

## 2019-06-07 LAB — CBC WITH DIFFERENTIAL/PLATELET
Basophils Absolute: 0.1 10*3/uL (ref 0.0–0.1)
Basophils Relative: 1 % (ref 0.0–3.0)
Eosinophils Absolute: 0.2 10*3/uL (ref 0.0–0.7)
Eosinophils Relative: 3.6 % (ref 0.0–5.0)
HCT: 47 % — ABNORMAL HIGH (ref 36.0–46.0)
Hemoglobin: 15.7 g/dL — ABNORMAL HIGH (ref 12.0–15.0)
Lymphocytes Relative: 34.9 % (ref 12.0–46.0)
Lymphs Abs: 1.8 10*3/uL (ref 0.7–4.0)
MCHC: 33.4 g/dL (ref 30.0–36.0)
MCV: 97.6 fl (ref 78.0–100.0)
Monocytes Absolute: 0.5 10*3/uL (ref 0.1–1.0)
Monocytes Relative: 9.4 % (ref 3.0–12.0)
Neutro Abs: 2.6 10*3/uL (ref 1.4–7.7)
Neutrophils Relative %: 51.1 % (ref 43.0–77.0)
Platelets: 193 10*3/uL (ref 150.0–400.0)
RBC: 4.82 Mil/uL (ref 3.87–5.11)
RDW: 13.4 % (ref 11.5–15.5)
WBC: 5.2 10*3/uL (ref 4.0–10.5)

## 2019-06-07 LAB — LIPID PANEL
Cholesterol: 211 mg/dL — ABNORMAL HIGH (ref 0–200)
HDL: 76.5 mg/dL (ref >39.00)
LDL Cholesterol: 116 mg/dL — ABNORMAL HIGH (ref 0–99)
NonHDL: 134.12
Total CHOL/HDL Ratio: 3
Triglycerides: 92 mg/dL (ref 0.0–149.0)
VLDL: 18.4 mg/dL (ref 0.0–40.0)

## 2019-06-07 LAB — BASIC METABOLIC PANEL
BUN: 23 mg/dL (ref 6–23)
CO2: 28 mEq/L (ref 19–32)
Calcium: 10.9 mg/dL — ABNORMAL HIGH (ref 8.4–10.5)
Chloride: 107 mEq/L (ref 96–112)
Creatinine, Ser: 1.05 mg/dL (ref 0.40–1.20)
GFR: 53.55 mL/min — ABNORMAL LOW (ref >60.00)
Glucose, Bld: 92 mg/dL (ref 70–99)
Potassium: 4.2 mEq/L (ref 3.5–5.1)
Sodium: 141 mEq/L (ref 135–145)

## 2019-06-07 LAB — HEPATIC FUNCTION PANEL
ALT: 14 U/L (ref 0–35)
AST: 17 U/L (ref 0–37)
Albumin: 4.1 g/dL (ref 3.5–5.2)
Alkaline Phosphatase: 80 U/L (ref 39–117)
Bilirubin, Direct: 0.1 mg/dL (ref 0.0–0.3)
Total Bilirubin: 0.4 mg/dL (ref 0.2–1.2)
Total Protein: 6.7 g/dL (ref 6.0–8.3)

## 2019-06-07 LAB — TSH: TSH: 4.32 u[IU]/mL (ref 0.35–4.50)

## 2019-06-07 LAB — VITAMIN D 25 HYDROXY (VIT D DEFICIENCY, FRACTURES): VITD: 39.7 ng/mL (ref 30.00–100.00)

## 2019-06-12 ENCOUNTER — Ambulatory Visit
Admission: RE | Admit: 2019-06-12 | Discharge: 2019-06-12 | Disposition: A | Discharge status: Home / Self Care | Payer: BC Managed Care – PPO | Source: Ambulatory Visit | Attending: Family Medicine | Admitting: Family Medicine

## 2019-06-12 ENCOUNTER — Other Ambulatory Visit: Payer: Self-pay

## 2019-06-12 DIAGNOSIS — Z1231 Encounter for screening mammogram for malignant neoplasm of breast: Secondary | ICD-10-CM | POA: Diagnosis not present

## 2019-06-14 ENCOUNTER — Encounter: Payer: Self-pay | Admitting: Family Medicine

## 2019-06-18 ENCOUNTER — Encounter: Payer: Self-pay | Admitting: Family Medicine

## 2019-06-19 ENCOUNTER — Ambulatory Visit: Payer: BC Managed Care – PPO | Attending: Internal Medicine

## 2019-06-19 DIAGNOSIS — Z20822 Contact with and (suspected) exposure to covid-19: Secondary | ICD-10-CM

## 2019-06-20 LAB — NOVEL CORONAVIRUS, NAA: SARS-CoV-2, NAA: NOT DETECTED

## 2019-06-25 ENCOUNTER — Ambulatory Visit: Payer: BC Managed Care – PPO | Attending: Internal Medicine

## 2019-06-25 DIAGNOSIS — Z20822 Contact with and (suspected) exposure to covid-19: Secondary | ICD-10-CM

## 2019-06-26 LAB — NOVEL CORONAVIRUS, NAA: SARS-CoV-2, NAA: DETECTED — AB

## 2019-06-27 ENCOUNTER — Telehealth: Payer: Self-pay | Admitting: Unknown Physician Specialty

## 2019-06-27 NOTE — Telephone Encounter (Signed)
Called to discuss with patient about Covid symptoms and the use of bamlanivimab, a monoclonal antibody infusion for those with mild to moderate Covid symptoms and at a high risk of hospitalization.  Pt is qualified for this infusion at the [facility] infusion center due to Age >55 with hypertension   Message left to call back

## 2019-06-27 NOTE — Telephone Encounter (Signed)
06/27/2019    I attempted to reach the patient regarding recent Covid diagnosis as well as opportunity for monoclonal antibody infusion.  It does look like the patient would qualify based off of age 59 with hypertension based off of medication review.  I have left a message with the telephone number: [phone] for the patient to call us back if they are interested.  I will also send a MyChart message detailing this as well.   Will route to PCP as FYI.  Coral Ceo, NP

## 2019-06-29 ENCOUNTER — Encounter: Payer: Self-pay | Admitting: Family Medicine

## 2019-07-02 ENCOUNTER — Other Ambulatory Visit: Payer: Self-pay | Admitting: Endocrinology

## 2019-07-02 NOTE — Telephone Encounter (Signed)
Pt returning call from Jess about doing a virtual visit. Would prefer to come in office on the 17th for her BP check as she cannot check it at home. Feeling much better, just waiting out her quarantine.

## 2019-07-04 ENCOUNTER — Ambulatory Visit: Payer: BC Managed Care – PPO | Admitting: Family Medicine

## 2019-07-11 ENCOUNTER — Encounter: Payer: Self-pay | Admitting: Family Medicine

## 2019-07-11 ENCOUNTER — Other Ambulatory Visit: Payer: Self-pay

## 2019-07-11 ENCOUNTER — Ambulatory Visit (INDEPENDENT_AMBULATORY_CARE_PROVIDER_SITE_OTHER): Payer: BC Managed Care – PPO | Admitting: Family Medicine

## 2019-07-11 VITALS — BP 124/81 | HR 68 | Temp 97.9°F | Resp 16 | Ht 64.0 in | Wt 147.0 lb

## 2019-07-11 DIAGNOSIS — I10 Essential (primary) hypertension: Secondary | ICD-10-CM

## 2019-07-11 LAB — BASIC METABOLIC PANEL
BUN: 19 mg/dL (ref 6–23)
CO2: 29 mEq/L (ref 19–32)
Calcium: 10.3 mg/dL (ref 8.4–10.5)
Chloride: 107 mEq/L (ref 96–112)
Creatinine, Ser: 0.99 mg/dL (ref 0.40–1.20)
GFR: 57.29 mL/min — ABNORMAL LOW (ref >60.00)
Glucose, Bld: 70 mg/dL (ref 70–99)
Potassium: 4.2 mEq/L (ref 3.5–5.1)
Sodium: 141 mEq/L (ref 135–145)

## 2019-07-11 NOTE — Assessment & Plan Note (Signed)
BP is much better today.  Pt is asymptomatic.  Since HCTZ was restarted at last visit will check BMP to assess K+ and Cr.  No anticipated med changes.  Will follow.

## 2019-07-11 NOTE — Progress Notes (Signed)
   Subjective:    Patient ID: Mariah Moss, female    DOB: 1960/11/23, 59 y.o.   MRN: [id number]  HPI HTN- chronic problem, on HCTZ 12.5mg  daily after restarting last month.  Denies CP, SOB, HAs, visual changes, edema.   Review of Systems For ROS see HPI   This visit occurred during the SARS-CoV-2 public health emergency.  Safety protocols were in place, including screening questions prior to the visit, additional usage of staff PPE, and extensive cleaning of exam room while observing appropriate contact time as indicated for disinfecting solutions.       Objective:   Physical Exam Vitals reviewed.  Constitutional:      General: She is not in acute distress.    Appearance: Normal appearance. She is well-developed.  HENT:     Head: Normocephalic and atraumatic.  Eyes:     Conjunctiva/sclera: Conjunctivae normal.     Pupils: Pupils are equal, round, and reactive to light.  Neck:     Thyroid: No thyromegaly.  Cardiovascular:     Rate and Rhythm: Normal rate and regular rhythm.     Heart sounds: Normal heart sounds. No murmur.  Pulmonary:     Effort: Pulmonary effort is normal. No respiratory distress.     Breath sounds: Normal breath sounds.  Abdominal:     General: There is no distension.     Palpations: Abdomen is soft.     Tenderness: There is no abdominal tenderness.  Musculoskeletal:     Cervical back: Normal range of motion and neck supple.  Lymphadenopathy:     Cervical: No cervical adenopathy.  Skin:    General: Skin is warm and dry.  Neurological:     Mental Status: She is alert and oriented to person, place, and time.  Psychiatric:        Behavior: Behavior normal.           Assessment & Plan:

## 2019-07-11 NOTE — Patient Instructions (Signed)
Follow up in 6 months to recheck BP We'll notify you of your lab results and make any changes if needed Continue the HCTZ daily Keep up the good work!  You look great! Call with any questions or concerns Stay Safe!!

## 2019-08-09 ENCOUNTER — Encounter: Payer: Self-pay | Admitting: Family Medicine

## 2019-08-13 ENCOUNTER — Encounter: Payer: Self-pay | Admitting: Family Medicine

## 2019-08-16 DIAGNOSIS — H5319 Other subjective visual disturbances: Secondary | ICD-10-CM | POA: Diagnosis not present

## 2019-08-16 DIAGNOSIS — H3561 Retinal hemorrhage, right eye: Secondary | ICD-10-CM | POA: Diagnosis not present

## 2019-08-30 ENCOUNTER — Other Ambulatory Visit: Payer: Self-pay | Admitting: Family Medicine

## 2019-09-03 DIAGNOSIS — L821 Other seborrheic keratosis: Secondary | ICD-10-CM | POA: Diagnosis not present

## 2019-09-03 DIAGNOSIS — L820 Inflamed seborrheic keratosis: Secondary | ICD-10-CM | POA: Diagnosis not present

## 2019-09-03 DIAGNOSIS — Z1283 Encounter for screening for malignant neoplasm of skin: Secondary | ICD-10-CM | POA: Diagnosis not present

## 2019-10-17 ENCOUNTER — Other Ambulatory Visit: Payer: Self-pay | Admitting: Family Medicine

## 2019-10-17 MED ORDER — CITALOPRAM HYDROBROMIDE 20 MG PO TABS: 20.0000 mg | ORAL_TABLET | Freq: Every day | ORAL | 0 refills | Status: DC

## 2019-12-10 NOTE — Progress Notes (Signed)
Patient ID: Mariah Moss, female   DOB: 1961/02/29, 59 y.o.   MRN: [id number]            Chief complaint: Endocrinology follow-up   Referring physician: Beverely Low  History of Present Illness:  HYPERCALCEMIA  She was seen in initial consultation for hypercalcemia in 11/2016   Review of records show that she has had a high calcium since 2013 and this has been variable  Even though her calcium had been above 11 since about 10/2016 it is below 11 subsequently  Her HCTZ was reduced to half a tablet in 2018 She has been asymptomatic with no history of kidney stones or fractures, does have osteopenia   Recent calcium pending, was normal earlier this year   Lab Results  Component Value Date   CALCIUM 10.3 07/11/2019   CALCIUM 10.9 (H) 06/06/2019   CALCIUM 10.5 12/11/2018   CALCIUM 10.8 (H) 06/01/2018   CALCIUM 10.3 12/09/2017   CALCIUM 11.1 (H) 11/08/2017   CALCIUM 11.3 (H) 04/26/2017   CALCIUM 11.1 (H) 11/29/2016   CALCIUM 11.3 (H) 10/27/2016    Lab Results  Component Value Date   PTH 120 (H) 11/09/2016   CALCIUM 10.3 07/11/2019      OSTEOPENIA:   Menopause at occurred at about age 60  She was recommended Evista for her osteopenia with baseline T score -1.8; she started this in late June 2019 No side effects with this such as hot flashes    Although her bone density in 2020 is relatively stable it is relatively lower in the radius   Lumbar spine L1-L4 (L3) Femoral neck (FN) 33% distal radius Ultra distal radius  T-score -0.8 RFN: -1.8 LFN: -1.9 -1.8 -2.5  Change in BMD from previous DXA test (%)  0.0%  -0.6%  -4.4% n/a    Bone density done on 12/21/16 showed:   Lumbar spine L1-L4 (L3) Femoral neck (FN) 33% distal R radius  T-score -0.8 RFN: -1.8 LFN: -1.7 -1.5    VITAMIN D deficiency:  25 (OH) Vitamin D level has previously been low  She is taking 2000 units of vitamin D3  Lab Results  Component Value Date   VD25OH 39.70 06/06/2019   VD25OH  36.10 12/11/2018   VD25OH 24.99 (L) 06/01/2018   VD25OH 31.89 11/29/2016      THYROID history: See review of systems   Allergies as of 12/11/2019   No Known Allergies     Medication List       Accurate as of December 11, 2019  8:30 AM. If you have any questions, ask your nurse or doctor.        citalopram 20 MG tablet Commonly known as: CELEXA Take 1 tablet (20 mg total) by mouth daily.   hydrochlorothiazide 12.5 MG tablet Commonly known as: HYDRODIURIL TAKE 1 TABLET BY MOUTH EVERY DAY   raloxifene 60 MG tablet Commonly known as: EVISTA TAKE 1 TABLET BY MOUTH EVERY DAY   Vitamin D (Cholecalciferol) 50 MCG (2000 UT) Caps Take by mouth.       Allergies: No Known Allergies  Past Medical History:  Diagnosis Date  . Heart murmur   . Hypertension     Past Surgical History:  Procedure Laterality Date  . BUNIONECTOMY  2000    Family History  Problem Relation Age of Onset  . Heart disease Mother   . Stroke Mother   . Hypertension Mother   . Cancer Father   . Diabetes Father     Social History:  reports that she has never smoked. She has never used smokeless tobacco. She reports current alcohol use. She reports that she does not use drugs.  Review of Systems  Long-standing history of hypertension, treated with Maxzide 25 mg for several years and now taking half a tablet because of hypercalcemia  BP Readings from Last 3 Encounters:  12/11/19 122/82  07/11/19 124/81  06/06/19 140/90    Subclinical hypothyroidism:   Previously had been found to have a slightly TSH by PCP  She also was felt to have a goiter on her initial exam in 7/18 but ultrasound showed only heterogenous thyroid enlargement and no nodules  In 12/18 at her annual exam she was recommended starting 50 g of levothyroxine by her PCP She has been off her levothyroxine supplement since 05/2017 since she did not feel any different after a trial of supplement  No complaints of fatigue  currently TSH last was normal in January   Lab Results  Component Value Date   TSH 4.32 06/06/2019   TSH 3.54 12/11/2018   TSH 2.79 06/01/2018   FREET4 0.76 12/11/2018   FREET4 0.84 12/09/2017   FREET4 0.86 11/08/2017    Taking long-term Celexa for anxiety   EXAM:  BP 122/82 (BP Location: Left Arm, Patient Position: Sitting, Cuff Size: Normal)   Pulse (!) 57   Ht 5\' 4"  (1.626 m)   Wt 145 lb 9.6 oz (66 kg)   LMP 12/14/2011   SpO2 97%   BMI 24.99 kg/m    Right thyroid lobe is palpable, 2-2 5 times normal, firm and slightly irregular.   Left side not palpable No lymph node enlargement felt Biceps reflexes are slightly brisk  Assessment/Plan:   HYPERCALCEMIA: She has had mild chronic hypercalcemia for several years She has primary hyperparathyroidism confirmed on her high PTH level previously  Since she does not have any significant osteoporosis or kidney stones she will likely need observation only Explained to her the location of parathyroid glands, function and nature of abnormalities related to PTH excess  Labs to be rechecked today  OSTEOPENIA: She is tolerating Evista Bone density was relatively stable last year and lowest T score -1.9  Vitamin D deficiency:  We will recheck her vitamin D level today She does need adequate vitamin D supplementation because of her osteopenia  THYROID enlargement likely from Hashimoto's thyroiditis Nodules not seen on previous ultrasound Previously had not benefited from a trial of levothyroxine with mildly increased TSH This is more recently normal  TSH will be rechecked No change in thyroid enlargement on today's exam    There are no Patient Instructions on file for this visit.   12/16/2011 12/11/2019, 8:30 AM

## 2019-12-11 ENCOUNTER — Other Ambulatory Visit: Payer: Self-pay

## 2019-12-11 ENCOUNTER — Encounter: Payer: Self-pay | Admitting: Endocrinology

## 2019-12-11 ENCOUNTER — Ambulatory Visit (INDEPENDENT_AMBULATORY_CARE_PROVIDER_SITE_OTHER): Payer: BC Managed Care – PPO | Admitting: Endocrinology

## 2019-12-11 VITALS — BP 122/82 | HR 57 | Ht 64.0 in | Wt 145.6 lb

## 2019-12-11 DIAGNOSIS — E063 Autoimmune thyroiditis: Secondary | ICD-10-CM | POA: Diagnosis not present

## 2019-12-11 DIAGNOSIS — E559 Vitamin D deficiency, unspecified: Secondary | ICD-10-CM

## 2019-12-11 DIAGNOSIS — E213 Hyperparathyroidism, unspecified: Secondary | ICD-10-CM | POA: Diagnosis not present

## 2019-12-11 DIAGNOSIS — M85852 Other specified disorders of bone density and structure, left thigh: Secondary | ICD-10-CM

## 2019-12-11 DIAGNOSIS — M85851 Other specified disorders of bone density and structure, right thigh: Secondary | ICD-10-CM | POA: Diagnosis not present

## 2019-12-11 LAB — VITAMIN D 25 HYDROXY (VIT D DEFICIENCY, FRACTURES): VITD: 53 ng/mL (ref 30.00–100.00)

## 2019-12-11 LAB — BASIC METABOLIC PANEL
BUN: 22 mg/dL (ref 6–23)
CO2: 30 mEq/L (ref 19–32)
Calcium: 11.3 mg/dL — ABNORMAL HIGH (ref 8.4–10.5)
Chloride: 103 mEq/L (ref 96–112)
Creatinine, Ser: 1.1 mg/dL (ref 0.40–1.20)
GFR: 50.66 mL/min — ABNORMAL LOW (ref >60.00)
Glucose, Bld: 100 mg/dL — ABNORMAL HIGH (ref 70–99)
Potassium: 4.4 mEq/L (ref 3.5–5.1)
Sodium: 138 mEq/L (ref 135–145)

## 2019-12-11 LAB — T4, FREE: Free T4: 0.84 ng/dL (ref 0.60–1.60)

## 2019-12-11 LAB — TSH: TSH: 3.23 u[IU]/mL (ref 0.35–4.50)

## 2020-01-03 ENCOUNTER — Other Ambulatory Visit: Payer: Self-pay | Admitting: Endocrinology

## 2020-01-03 NOTE — Telephone Encounter (Signed)
Outpatient Medication Detail   Disp Refills Start End   raloxifene (EVISTA) 60 MG tablet 90 tablet 1 01/03/2020    Sig: TAKE 1 TABLET BY MOUTH EVERY DAY   Sent to pharmacy as: raloxifene (EVISTA) 60 MG tablet   E-Prescribing Status: Receipt confirmed by pharmacy (01/03/2020 3:24 PM EDT)

## 2020-01-24 ENCOUNTER — Other Ambulatory Visit: Payer: Self-pay | Admitting: Family Medicine

## 2020-01-24 MED ORDER — CITALOPRAM HYDROBROMIDE 20 MG PO TABS: 20.0000 mg | ORAL_TABLET | Freq: Every day | ORAL | 0 refills | Status: DC

## 2020-03-05 ENCOUNTER — Other Ambulatory Visit: Payer: Self-pay | Admitting: Family Medicine

## 2020-04-16 ENCOUNTER — Other Ambulatory Visit: Payer: Self-pay | Admitting: Family Medicine

## 2020-06-02 ENCOUNTER — Other Ambulatory Visit: Payer: Self-pay | Admitting: Family Medicine

## 2020-06-02 DIAGNOSIS — Z1231 Encounter for screening mammogram for malignant neoplasm of breast: Secondary | ICD-10-CM

## 2020-07-01 ENCOUNTER — Encounter: Payer: Self-pay | Admitting: Family Medicine

## 2020-07-01 ENCOUNTER — Other Ambulatory Visit: Payer: Self-pay | Admitting: Endocrinology

## 2020-07-02 DIAGNOSIS — Z780 Asymptomatic menopausal state: Secondary | ICD-10-CM | POA: Diagnosis not present

## 2020-07-02 DIAGNOSIS — Z1151 Encounter for screening for human papillomavirus (HPV): Secondary | ICD-10-CM | POA: Diagnosis not present

## 2020-07-02 DIAGNOSIS — Z6825 Body mass index (BMI) 25.0-25.9, adult: Secondary | ICD-10-CM | POA: Diagnosis not present

## 2020-07-02 DIAGNOSIS — Z01419 Encounter for gynecological examination (general) (routine) without abnormal findings: Secondary | ICD-10-CM | POA: Diagnosis not present

## 2020-07-02 DIAGNOSIS — Z130 Encounter for screening for diseases of the blood and blood-forming organs and certain disorders involving the immune mechanism: Secondary | ICD-10-CM | POA: Diagnosis not present

## 2020-07-02 DIAGNOSIS — Z124 Encounter for screening for malignant neoplasm of cervix: Secondary | ICD-10-CM | POA: Diagnosis not present

## 2020-07-02 LAB — HM PAP SMEAR

## 2020-07-11 ENCOUNTER — Ambulatory Visit
Admission: RE | Admit: 2020-07-11 | Discharge: 2020-07-11 | Disposition: A | Discharge status: Home / Self Care | Payer: BC Managed Care – PPO | Source: Ambulatory Visit | Attending: Family Medicine | Admitting: Family Medicine

## 2020-07-11 ENCOUNTER — Other Ambulatory Visit: Payer: Self-pay

## 2020-07-11 DIAGNOSIS — Z1231 Encounter for screening mammogram for malignant neoplasm of breast: Secondary | ICD-10-CM | POA: Diagnosis not present

## 2020-07-17 ENCOUNTER — Other Ambulatory Visit: Payer: Self-pay | Admitting: Family Medicine

## 2020-07-17 DIAGNOSIS — R928 Other abnormal and inconclusive findings on diagnostic imaging of breast: Secondary | ICD-10-CM

## 2020-07-21 ENCOUNTER — Other Ambulatory Visit: Payer: Self-pay | Admitting: Family Medicine

## 2020-07-24 ENCOUNTER — Ambulatory Visit: Payer: BC Managed Care – PPO | Admitting: Family Medicine

## 2020-07-30 ENCOUNTER — Ambulatory Visit
Admission: RE | Admit: 2020-07-30 | Discharge: 2020-07-30 | Disposition: A | Discharge status: Home / Self Care | Payer: BC Managed Care – PPO | Source: Ambulatory Visit | Attending: Family Medicine | Admitting: Family Medicine

## 2020-07-30 ENCOUNTER — Other Ambulatory Visit: Payer: Self-pay

## 2020-07-30 DIAGNOSIS — N6489 Other specified disorders of breast: Secondary | ICD-10-CM | POA: Diagnosis not present

## 2020-07-30 DIAGNOSIS — R928 Other abnormal and inconclusive findings on diagnostic imaging of breast: Secondary | ICD-10-CM | POA: Diagnosis not present

## 2020-07-30 DIAGNOSIS — R922 Inconclusive mammogram: Secondary | ICD-10-CM | POA: Diagnosis not present

## 2020-08-26 ENCOUNTER — Other Ambulatory Visit: Payer: Self-pay

## 2020-08-26 ENCOUNTER — Ambulatory Visit (INDEPENDENT_AMBULATORY_CARE_PROVIDER_SITE_OTHER): Payer: BC Managed Care – PPO | Admitting: Family Medicine

## 2020-08-26 ENCOUNTER — Encounter: Payer: Self-pay | Admitting: Family Medicine

## 2020-08-26 VITALS — BP 115/85 | HR 62 | Temp 98.4°F | Resp 16 | Ht 64.0 in | Wt 143.6 lb

## 2020-08-26 DIAGNOSIS — Z1211 Encounter for screening for malignant neoplasm of colon: Secondary | ICD-10-CM

## 2020-08-26 DIAGNOSIS — I10 Essential (primary) hypertension: Secondary | ICD-10-CM

## 2020-08-26 DIAGNOSIS — E213 Hyperparathyroidism, unspecified: Secondary | ICD-10-CM

## 2020-08-26 DIAGNOSIS — Z0000 Encounter for general adult medical examination without abnormal findings: Secondary | ICD-10-CM

## 2020-08-26 LAB — CBC WITH DIFFERENTIAL/PLATELET
Basophils Absolute: 0.1 10*3/uL (ref 0.0–0.1)
Basophils Relative: 1.4 % (ref 0.0–3.0)
Eosinophils Absolute: 0.1 10*3/uL (ref 0.0–0.7)
Eosinophils Relative: 2.4 % (ref 0.0–5.0)
HCT: 42.7 % (ref 36.0–46.0)
Hemoglobin: 14.6 g/dL (ref 12.0–15.0)
Lymphocytes Relative: 33.2 % (ref 12.0–46.0)
Lymphs Abs: 1.2 10*3/uL (ref 0.7–4.0)
MCHC: 34.2 g/dL (ref 30.0–36.0)
MCV: 96.2 fl (ref 78.0–100.0)
Monocytes Absolute: 0.3 10*3/uL (ref 0.1–1.0)
Monocytes Relative: 9.6 % (ref 3.0–12.0)
Neutro Abs: 1.9 10*3/uL (ref 1.4–7.7)
Neutrophils Relative %: 53.4 % (ref 43.0–77.0)
Platelets: 204 10*3/uL (ref 150.0–400.0)
RBC: 4.44 Mil/uL (ref 3.87–5.11)
RDW: 13.9 % (ref 11.5–15.5)
WBC: 3.6 10*3/uL — ABNORMAL LOW (ref 4.0–10.5)

## 2020-08-26 LAB — HEPATIC FUNCTION PANEL
ALT: 15 U/L (ref 0–35)
AST: 22 U/L (ref 0–37)
Albumin: 3.8 g/dL (ref 3.5–5.2)
Alkaline Phosphatase: 64 U/L (ref 39–117)
Bilirubin, Direct: 0.1 mg/dL (ref 0.0–0.3)
Total Bilirubin: 0.4 mg/dL (ref 0.2–1.2)
Total Protein: 7.1 g/dL (ref 6.0–8.3)

## 2020-08-26 LAB — LIPID PANEL
Cholesterol: 191 mg/dL (ref 0–200)
HDL: 71.3 mg/dL (ref >39.00)
LDL Cholesterol: 107 mg/dL — ABNORMAL HIGH (ref 0–99)
NonHDL: 119.47
Total CHOL/HDL Ratio: 3
Triglycerides: 61 mg/dL (ref 0.0–149.0)
VLDL: 12.2 mg/dL (ref 0.0–40.0)

## 2020-08-26 LAB — VITAMIN D 25 HYDROXY (VIT D DEFICIENCY, FRACTURES): VITD: 45.24 ng/mL (ref 30.00–100.00)

## 2020-08-26 LAB — BASIC METABOLIC PANEL
BUN: 18 mg/dL (ref 6–23)
CO2: 25 mEq/L (ref 19–32)
Calcium: 10.6 mg/dL — ABNORMAL HIGH (ref 8.4–10.5)
Chloride: 107 mEq/L (ref 96–112)
Creatinine, Ser: 1.04 mg/dL (ref 0.40–1.20)
GFR: 58.33 mL/min — ABNORMAL LOW (ref >60.00)
Glucose, Bld: 67 mg/dL — ABNORMAL LOW (ref 70–99)
Potassium: 3.8 mEq/L (ref 3.5–5.1)
Sodium: 138 mEq/L (ref 135–145)

## 2020-08-26 LAB — TSH: TSH: 4.47 u[IU]/mL (ref 0.35–4.50)

## 2020-08-26 NOTE — Patient Instructions (Signed)
Follow up in 1 year or as needed We'll notify you of your lab results and make any changes if needed Keep up the good work on healthy diet and regular exercise- you look great!!! Call with any questions or concerns Stay Safe!  Stay Healthy! Happy Spring!!! 

## 2020-08-26 NOTE — Assessment & Plan Note (Signed)
Check Vit D and replete prn. 

## 2020-08-26 NOTE — Assessment & Plan Note (Signed)
Pt's PE WNL.  UTD on pap, mammo, flu, Tdap, COVID.  Referral for GI placed.  Check labs.  Anticipatory guidance provided.

## 2020-08-26 NOTE — Assessment & Plan Note (Signed)
Excellent control on HCTZ and asymptomatic.  Check labs.  No anticipated med changes.

## 2020-08-26 NOTE — Progress Notes (Signed)
   Subjective:    Patient ID: Mariah Moss, female    DOB: 02-May-1961, 60 y.o.   MRN: [id number]  HPI CPE- UTD on pap, mammo, flu, Tdap, COVID.  Due for colonoscopy.  Reviewed past medical, surgical, family and social histories.   Patient Care Team    Relationship Specialty Notifications Start End  Sheliah Hatch, MD PCP - General Family Medicine  10/14/11   Key, Verita Schneiders, NP Nurse Practitioner Gynecology  12/09/14      Health Maintenance  Topic Date Due  . COLONOSCOPY (Pts 45-77yrs Insurance coverage will need to be confirmed)  12/07/2019  . Hepatitis C Screening  08/26/2021 (Originally Jan 28, 1961)  . HIV Screening  08/26/2021 (Originally 12/06/1974)  . INFLUENZA VACCINE  12/22/2020  . MAMMOGRAM  07/11/2021  . PAP SMEAR-Modifier  07/03/2023  . TETANUS/TDAP  12/08/2024  . COVID-19 Vaccine  Completed  . HPV VACCINES  Aged Out      Review of Systems Patient reports no vision/ hearing changes, adenopathy,fever, weight change,  persistant/recurrent hoarseness , swallowing issues, chest pain, palpitations, edema, persistant/recurrent cough, hemoptysis, dyspnea (rest/exertional/paroxysmal nocturnal), gastrointestinal bleeding (melena, rectal bleeding), abdominal pain, significant heartburn, bowel changes, GU symptoms (dysuria, hematuria, incontinence), Gyn symptoms (abnormal  bleeding, pain),  syncope, focal weakness, memory loss, numbness & tingling, skin/hair/nail changes, abnormal bruising or bleeding, anxiety, or depression.   This visit occurred during the SARS-CoV-2 public health emergency.  Safety protocols were in place, including screening questions prior to the visit, additional usage of staff PPE, and extensive cleaning of exam room while observing appropriate contact time as indicated for disinfecting solutions.       Objective:   Physical Exam General Appearance:    Alert, cooperative, no distress, appears stated age  Head:    Normocephalic, without obvious  abnormality, atraumatic  Eyes:    PERRL, conjunctiva/corneas clear, EOM's intact, fundi    benign, both eyes  Ears:    Normal TM's and external ear canals, both ears  Nose:   Deferred due to COVID  Throat:   Neck:   Supple, symmetrical, trachea midline, no adenopathy;    Thyroid: no enlargement/tenderness/nodules  Back:     Symmetric, no curvature, ROM normal, no CVA tenderness  Lungs:     Clear to auscultation bilaterally, respirations unlabored  Chest Wall:    No tenderness or deformity   Heart:    Regular rate and rhythm, S1 and S2 normal, no murmur, rub   or gallop  Breast Exam:    Deferred to GYN  Abdomen:     Soft, non-tender, bowel sounds active all four quadrants,    no masses, no organomegaly  Genitalia:    Deferred to GYN  Rectal:    Extremities:   Extremities normal, atraumatic, no cyanosis or edema  Pulses:   2+ and symmetric all extremities  Skin:   Skin color, texture, turgor normal, no rashes or lesions  Lymph nodes:   Cervical, supraclavicular, and axillary nodes normal  Neurologic:   CNII-XII intact, normal strength, sensation and reflexes    throughout          Assessment & Plan:

## 2020-09-03 DIAGNOSIS — L820 Inflamed seborrheic keratosis: Secondary | ICD-10-CM | POA: Diagnosis not present

## 2020-09-03 DIAGNOSIS — Z1283 Encounter for screening for malignant neoplasm of skin: Secondary | ICD-10-CM | POA: Diagnosis not present

## 2020-09-03 DIAGNOSIS — L821 Other seborrheic keratosis: Secondary | ICD-10-CM | POA: Diagnosis not present

## 2020-09-06 ENCOUNTER — Other Ambulatory Visit: Payer: Self-pay | Admitting: Family Medicine

## 2020-09-10 ENCOUNTER — Encounter: Payer: Self-pay | Admitting: Family Medicine

## 2020-10-13 ENCOUNTER — Encounter: Payer: Self-pay | Admitting: Family Medicine

## 2020-10-26 ENCOUNTER — Other Ambulatory Visit: Payer: Self-pay | Admitting: Family Medicine

## 2020-10-27 ENCOUNTER — Telehealth: Payer: Self-pay | Admitting: Family Medicine

## 2020-10-27 NOTE — Telephone Encounter (Signed)
Patient called with poison ivy rash - she would like something called in - has had this several times in the past - No virtual appointments open today or tomorrow.  If you will call it in - please send to [facility] [address] - [place], [place]

## 2020-10-28 ENCOUNTER — Telehealth (INDEPENDENT_AMBULATORY_CARE_PROVIDER_SITE_OTHER): Payer: BC Managed Care – PPO | Admitting: Registered Nurse

## 2020-10-28 ENCOUNTER — Other Ambulatory Visit: Payer: Self-pay

## 2020-10-28 ENCOUNTER — Encounter: Payer: Self-pay | Admitting: Registered Nurse

## 2020-10-28 DIAGNOSIS — L237 Allergic contact dermatitis due to plants, except food: Secondary | ICD-10-CM | POA: Diagnosis not present

## 2020-10-28 MED ORDER — TRIAMCINOLONE ACETONIDE 0.1 % EX CREA: 1.0000 "application " | TOPICAL_CREAM | Freq: Two times a day (BID) | CUTANEOUS | 0 refills | Status: DC

## 2020-10-28 MED ORDER — PREDNISONE 10 MG PO TABS: ORAL_TABLET | ORAL | 0 refills | Status: AC

## 2020-10-28 NOTE — Progress Notes (Signed)
Telemedicine Encounter- SOAP NOTE Established Patient  This telephone encounter was conducted with the patient's (or proxy's) verbal consent via audio telecommunications: yes  Patient was instructed to have this encounter in a suitably private space; and to only have persons present to whom they give permission to participate. In addition, patient identity was confirmed by use of name plus two identifiers (DOB and address).  I discussed the limitations, risks, security and privacy concerns of performing an evaluation and management service by telephone and the availability of in person appointments. I also discussed with the patient that there may be a patient responsible charge related to this service. The patient expressed understanding and agreed to proceed.  I spent a total of 16 minutes talking with the patient or their proxy.  Patient at work - safe, Higher education careers adviser in office  Participants: Jari Sportsman, NP and Belva Bertin  Chief Complaint  Patient presents with  . Poison Ivy    Patient states she has got into some poison ivy on Sunday. Patient has noticed it on both arms and legs. She is using something she already had and it its still spreading.    Subjective   Mariah Moss is a 60 y.o. established patient. Telephone visit today for plant contact dermatitis  HPI Onset Sunday Both arms and legs Itchy, uncomfortable Using topical OTC  Has had prednisone PO and steroids IM with good effect in the past  No involvement of face or genitals in rash  Patient Active Problem List   Diagnosis Date Noted  . Hyperlipidemia 11/08/2017  . Hypothyroid 06/13/2017  . Hyperparathyroidism (HCC) 06/13/2017  . Benign paroxysmal positional vertigo 01/22/2014  . Hypercalcemia 10/29/2011  . General medical examination 10/14/2011  . HTN (hypertension) 10/14/2011  . Anxiety 10/14/2011    Past Medical History:  Diagnosis Date  . Heart murmur   . Hypertension      Current Outpatient Medications  Medication Sig Dispense Refill  . citalopram (CELEXA) 20 MG tablet TAKE 1 TABLET BY MOUTH EVERY DAY 90 tablet 0  . hydrochlorothiazide (HYDRODIURIL) 12.5 MG tablet TAKE 1 TABLET BY MOUTH EVERY DAY 90 tablet 1  . raloxifene (EVISTA) 60 MG tablet TAKE 1 TABLET BY MOUTH EVERY DAY 90 tablet 1  . Vitamin D, Cholecalciferol, 50 MCG (2000 UT) CAPS Take by mouth.     No current facility-administered medications for this visit.    No Known Allergies  Social History   Socioeconomic History  . Marital status: Married    Spouse name: Not on file  . Number of children: Not on file  . Years of education: Not on file  . Highest education level: Not on file  Occupational History  . Not on file  Tobacco Use  . Smoking status: Never Smoker  . Smokeless tobacco: Never Used  Vaping Use  . Vaping Use: Never used  Substance and Sexual Activity  . Alcohol use: Yes    Comment: occasionally  . Drug use: No  . Sexual activity: Not Currently  Other Topics Concern  . Not on file  Social History Narrative  . Not on file   Social Determinants of Health   Financial Resource Strain: Not on file  Food Insecurity: Not on file  Transportation Needs: Not on file  Physical Activity: Not on file  Stress: Not on file  Social Connections: Not on file  Intimate Partner Violence: Not on file    Review of Systems  Constitutional: Negative.   HENT: Negative.  Eyes: Negative.   Respiratory: Negative.   Cardiovascular: Negative.   Gastrointestinal: Negative.   Genitourinary: Negative.   Musculoskeletal: Negative.   Skin: Positive for itching and rash.  Neurological: Negative.   Endo/Heme/Allergies: Negative.   Psychiatric/Behavioral: Negative.   All other systems reviewed and are negative.   Objective   Vitals as reported by the patient: There were no vitals filed for this visit.  There are no diagnoses linked to this encounter.  PLAN  Prednisone  taper  Refill triamcinolone  Return if worsening or failing to improve  Patient encouraged to call clinic with any questions, comments, or concerns.  I discussed the assessment and treatment plan with the patient. The patient was provided an opportunity to ask questions and all were answered. The patient agreed with the plan and demonstrated an understanding of the instructions.   The patient was advised to call back or seek an in-person evaluation if the symptoms worsen or if the condition fails to improve as anticipated.  I provided 16 minutes of non-face-to-face time during this encounter.  Janeece Agee, NP  Primary Care at [facility]

## 2020-10-28 NOTE — Patient Instructions (Signed)
° ° ° °  If you have lab work done today you will be contacted with your lab results within the next 2 weeks.  If you have not heard from us then please contact us. The fastest way to get your results is to register for My Chart. ° ° °IF you received an x-ray today, you will receive an invoice from [facility]. Please contact [facility] at [phone] with questions or concerns regarding your invoice.  ° °IF you received labwork today, you will receive an invoice from [facility]. Please contact [facility] at [phone] with questions or concerns regarding your invoice.  ° °Our billing staff will not be able to assist you with questions regarding bills from these companies. ° °You will be contacted with the lab results as soon as they are available. The fastest way to get your results is to activate your My Chart account. Instructions are located on the last page of this paperwork. If you have not heard from us regarding the results in 2 weeks, please contact this office. °  ° ° ° °

## 2020-10-28 NOTE — Telephone Encounter (Signed)
Pt would need appt- at least a video visit if she can't make it to another office- in order for prescription to be sent

## 2020-10-28 NOTE — Telephone Encounter (Signed)
Called patient to inform. Patient scheduled with NP Kateri Plummer at 11:50.

## 2020-10-31 ENCOUNTER — Telehealth: Payer: Self-pay | Admitting: Family Medicine

## 2020-10-31 NOTE — Telephone Encounter (Signed)
Patient was told to call back if her poison ivy wasn't getting better - patient states that it is better but not gone.  She thinks she may need something stronger than the predisone.  Please advise

## 2020-11-19 ENCOUNTER — Encounter: Payer: Self-pay | Admitting: *Deleted

## 2020-12-08 ENCOUNTER — Other Ambulatory Visit (INDEPENDENT_AMBULATORY_CARE_PROVIDER_SITE_OTHER): Payer: BC Managed Care – PPO

## 2020-12-08 ENCOUNTER — Other Ambulatory Visit: Payer: BC Managed Care – PPO

## 2020-12-08 DIAGNOSIS — E063 Autoimmune thyroiditis: Secondary | ICD-10-CM

## 2020-12-08 DIAGNOSIS — E213 Hyperparathyroidism, unspecified: Secondary | ICD-10-CM | POA: Diagnosis not present

## 2020-12-08 LAB — BASIC METABOLIC PANEL
BUN: 21 mg/dL (ref 6–23)
CO2: 27 mEq/L (ref 19–32)
Calcium: 10.6 mg/dL — ABNORMAL HIGH (ref 8.4–10.5)
Chloride: 107 mEq/L (ref 96–112)
Creatinine, Ser: 1.14 mg/dL (ref 0.40–1.20)
GFR: 52.14 mL/min — ABNORMAL LOW (ref >60.00)
Glucose, Bld: 89 mg/dL (ref 70–99)
Potassium: 3.7 mEq/L (ref 3.5–5.1)
Sodium: 141 mEq/L (ref 135–145)

## 2020-12-08 LAB — TSH: TSH: 2.77 u[IU]/mL (ref 0.35–5.50)

## 2020-12-08 LAB — T4, FREE: Free T4: 0.93 ng/dL (ref 0.60–1.60)

## 2020-12-10 NOTE — Progress Notes (Signed)
Patient ID: Mariah Moss, female   DOB: 1960/08/29, 60 y.o.   MRN: [id number]           Chief complaint: Endocrinology follow-up   Referring physician: Beverely Low  History of Present Illness:  HYPERCALCEMIA  She was seen in initial consultation for hypercalcemia in 11/2016   Review of records show that she has had a high calcium since 2013 and this has been variable  Usually calcium has been below 11  Her HCTZ was reduced to half a tablet in 2018  She has been asymptomatic with no history of kidney stones or fractures Has osteopenia as discussed below    Lab Results  Component Value Date   CALCIUM 10.6 (H) 12/08/2020   CALCIUM 10.6 (H) 08/26/2020   CALCIUM 11.3 (H) 12/11/2019   CALCIUM 10.3 07/11/2019   CALCIUM 10.9 (H) 06/06/2019   CALCIUM 10.5 12/11/2018   CALCIUM 10.8 (H) 06/01/2018   CALCIUM 10.3 12/09/2017   CALCIUM 11.1 (H) 11/08/2017    Lab Results  Component Value Date   PTH 120 (H) 11/09/2016   CALCIUM 10.6 (H) 12/08/2020      OSTEOPENIA:   Menopause at occurred at about age 71  She was started on Evista for her osteopenia with baseline T score -1.8 She has been taking this since June 2019 Does not complain of any hot flashes  Although her bone density in 2020 is relatively stable it was relatively lower in the radius   Lumbar spine L1-L4 (L3) Femoral neck (FN) 33% distal radius Ultra distal radius  T-score -0.8 RFN: -1.8 LFN: -1.9 -1.8 -2.5  Change in BMD from previous DXA test (%)  0.0%  -0.6%  -4.4% n/a    Bone density done on 12/21/16 results:    Lumbar spine L1-L4 (L3) Femoral neck (FN) 33% distal R radius  T-score -0.8 RFN: -1.8 LFN: -1.7 -1.5     VITAMIN D deficiency:  25 (OH) Vitamin D level has previously been low  She is taking 2000 units of vitamin D3  Lab Results  Component Value Date   VD25OH 45.24 08/26/2020   VD25OH 53.00 12/11/2019   VD25OH 39.70 06/06/2019   VD25OH 36.10 12/11/2018    THYROID history: See  review of systems   Allergies as of 12/11/2020   No Known Allergies      Medication List        Accurate as of December 11, 2020  8:21 AM. If you have any questions, ask your nurse or doctor.          citalopram 20 MG tablet Commonly known as: CELEXA TAKE 1 TABLET BY MOUTH EVERY DAY   hydrochlorothiazide 12.5 MG tablet Commonly known as: HYDRODIURIL TAKE 1 TABLET BY MOUTH EVERY DAY   raloxifene 60 MG tablet Commonly known as: EVISTA TAKE 1 TABLET BY MOUTH EVERY DAY   triamcinolone cream 0.1 % Commonly known as: KENALOG Apply 1 application topically 2 (two) times daily.   vitamin D3 50 MCG (2000 UT) Caps Take by mouth.        Allergies: No Known Allergies  Past Medical History:  Diagnosis Date   Heart murmur    Hypertension     Past Surgical History:  Procedure Laterality Date   BUNIONECTOMY  2000    Family History  Problem Relation Age of Onset   Heart disease Mother    Stroke Mother    Hypertension Mother    Cancer Father    Diabetes Father    Thyroid disease  Neg Hx     Social History:  reports that she has never smoked. She has never used smokeless tobacco. She reports current alcohol use. She reports that she does not use drugs.  Review of Systems  Long-standing history of hypertension, treated with 25 mg for several years Controlled with 12.5 mg of hydrochlorothiazide  BP Readings from Last 3 Encounters:  12/11/20 128/82  08/26/20 115/85  12/11/19 122/82    Subclinical hypothyroidism:   Previously had been found to have a slightly TSH by PCP  She also was felt to have a goiter on her initial exam in 7/18 but ultrasound showed only heterogenous thyroid enlargement and no nodules  In 12/18 at her annual exam she was recommended starting 50 g of levothyroxine by her PCP She has been off her levothyroxine supplement since 05/2017 since she did not feel any different after a trial of supplement  She feels fairly good TSH is consistently  normal now   Lab Results  Component Value Date   TSH 2.77 12/08/2020   TSH 4.47 08/26/2020   TSH 3.23 12/11/2019   FREET4 0.93 12/08/2020   FREET4 0.84 12/11/2019   FREET4 0.76 12/11/2018    Taking long-term Celexa for anxiety   EXAM:  BP 128/82 (BP Location: Left Arm, Patient Position: Sitting, Cuff Size: Normal)   Pulse 63   Ht 5\' 4"  (1.626 m)   Wt 148 lb 3.2 oz (67.2 kg)   LMP 12/14/2011   SpO2 99%   BMI 25.44 kg/m    Right thyroid lobe is enlarged about 1-1/2-2 times normal, firm with slightly irregular texture  Left lobe is just palpable on swallowing, firm without nodularity  No lymph node enlargement felt Biceps reflexes show normal relaxation  Assessment/Plan:   HYPERCALCEMIA: She has had mild chronic hypercalcemia for several years She has primary hyperparathyroidism confirmed on her high PTH level previously Calcium is still under 11  Since she does not have any significant osteoporosis or kidney stones she will likely need observation only However will need follow-up bone density which was ordered   OSTEOPENIA: She is being treated with Evista since 2019 Bone density was relatively stable last year and lowest T score -1.9, this will be rechecked Discussed that Evista helps reduce fractures even though bone density may not increase  Vitamin D deficiency: Adequately replaced She does need adequate vitamin D supplementation because of her osteopenia  THYROID enlargement possibly from Hashimoto's thyroiditis Thyroid enlargement appears to be less than last year on the right side TSH normal  Mild hypertension: Well-controlled and continue to follow-up with PCP  Follow-up visit in 1 year   There are no Patient Instructions on file for this visit.   2020 12/11/2020, 8:21 AM

## 2020-12-11 ENCOUNTER — Encounter: Payer: Self-pay | Admitting: Endocrinology

## 2020-12-11 ENCOUNTER — Ambulatory Visit (INDEPENDENT_AMBULATORY_CARE_PROVIDER_SITE_OTHER): Payer: BC Managed Care – PPO | Admitting: Endocrinology

## 2020-12-11 ENCOUNTER — Other Ambulatory Visit: Payer: Self-pay

## 2020-12-11 VITALS — BP 128/82 | HR 63 | Ht 64.0 in | Wt 148.2 lb

## 2020-12-11 DIAGNOSIS — M85851 Other specified disorders of bone density and structure, right thigh: Secondary | ICD-10-CM

## 2020-12-11 DIAGNOSIS — E049 Nontoxic goiter, unspecified: Secondary | ICD-10-CM | POA: Diagnosis not present

## 2020-12-11 DIAGNOSIS — M85852 Other specified disorders of bone density and structure, left thigh: Secondary | ICD-10-CM

## 2020-12-11 DIAGNOSIS — E213 Hyperparathyroidism, unspecified: Secondary | ICD-10-CM

## 2020-12-22 ENCOUNTER — Other Ambulatory Visit: Payer: Self-pay

## 2020-12-22 ENCOUNTER — Emergency Department (HOSPITAL_BASED_OUTPATIENT_CLINIC_OR_DEPARTMENT_OTHER): Payer: BC Managed Care – PPO

## 2020-12-22 ENCOUNTER — Encounter: Payer: Self-pay | Admitting: Registered Nurse

## 2020-12-22 ENCOUNTER — Emergency Department (HOSPITAL_BASED_OUTPATIENT_CLINIC_OR_DEPARTMENT_OTHER)
Admission: EM | Admit: 2020-12-22 | Discharge: 2020-12-22 | Disposition: A | Discharge status: Home / Self Care | Payer: BC Managed Care – PPO | Attending: Emergency Medicine | Admitting: Emergency Medicine

## 2020-12-22 ENCOUNTER — Encounter (HOSPITAL_BASED_OUTPATIENT_CLINIC_OR_DEPARTMENT_OTHER): Payer: Self-pay

## 2020-12-22 DIAGNOSIS — M7989 Other specified soft tissue disorders: Secondary | ICD-10-CM | POA: Diagnosis not present

## 2020-12-22 DIAGNOSIS — M79671 Pain in right foot: Secondary | ICD-10-CM

## 2020-12-22 DIAGNOSIS — Y92009 Unspecified place in unspecified non-institutional (private) residence as the place of occurrence of the external cause: Secondary | ICD-10-CM | POA: Diagnosis not present

## 2020-12-22 DIAGNOSIS — E039 Hypothyroidism, unspecified: Secondary | ICD-10-CM | POA: Insufficient documentation

## 2020-12-22 DIAGNOSIS — Z79899 Other long term (current) drug therapy: Secondary | ICD-10-CM | POA: Insufficient documentation

## 2020-12-22 DIAGNOSIS — M19071 Primary osteoarthritis, right ankle and foot: Secondary | ICD-10-CM | POA: Diagnosis not present

## 2020-12-22 DIAGNOSIS — S9031XA Contusion of right foot, initial encounter: Secondary | ICD-10-CM | POA: Diagnosis not present

## 2020-12-22 DIAGNOSIS — T1490XA Injury, unspecified, initial encounter: Secondary | ICD-10-CM

## 2020-12-22 DIAGNOSIS — W1842XA Slipping, tripping and stumbling without falling due to stepping into hole or opening, initial encounter: Secondary | ICD-10-CM | POA: Insufficient documentation

## 2020-12-22 DIAGNOSIS — S99921A Unspecified injury of right foot, initial encounter: Secondary | ICD-10-CM | POA: Diagnosis not present

## 2020-12-22 DIAGNOSIS — I10 Essential (primary) hypertension: Secondary | ICD-10-CM | POA: Insufficient documentation

## 2020-12-22 NOTE — ED Provider Notes (Signed)
[facility] EMERGENCY DEPARTMENT Provider Note   CSN: [id number] Arrival date & time: 12/22/20  1123     History Chief Complaint  Patient presents with   Foot Injury    Mariah Moss is a 60 y.o. female.  HPI Patient is a 60 year old female with a history of hypertension who presents to the emergency department due to right foot pain and swelling that began 2 days ago.  Patient states she was on vacation and stepped in a hole in her friend's yard and rolled the right ankle.  She reports swelling and bruising diffusely to the right foot but pain only along the dorsum of the foot.  Denies any ankle pain.  States she has been ambulating but has worsening pain with ambulation.  No other complaints.    Past Medical History:  Diagnosis Date   Heart murmur    Hypertension     Patient Active Problem List   Diagnosis Date Noted   Hyperlipidemia 11/08/2017   Hypothyroid 06/13/2017   Hyperparathyroidism (HCC) 06/13/2017   Benign paroxysmal positional vertigo 01/22/2014   Hypercalcemia 10/29/2011   General medical examination 10/14/2011   HTN (hypertension) 10/14/2011   Anxiety 10/14/2011    Past Surgical History:  Procedure Laterality Date   BUNIONECTOMY  2000     OB History   No obstetric history on file.     Family History  Problem Relation Age of Onset   Heart disease Mother    Stroke Mother    Hypertension Mother    Cancer Father    Diabetes Father    Thyroid disease Neg Hx     Social History   Tobacco Use   Smoking status: Never   Smokeless tobacco: Never  Vaping Use   Vaping Use: Never used  Substance Use Topics   Alcohol use: Yes    Comment: occasionally   Drug use: No    Home Medications Prior to Admission medications   Medication Sig Start Date End Date Taking? Authorizing Provider  citalopram (CELEXA) 20 MG tablet TAKE 1 TABLET BY MOUTH EVERY DAY 10/27/20   Sheliah Hatch, MD  hydrochlorothiazide (HYDRODIURIL) 12.5 MG tablet  TAKE 1 TABLET BY MOUTH EVERY DAY 09/08/20   Sheliah Hatch, MD  raloxifene (EVISTA) 60 MG tablet TAKE 1 TABLET BY MOUTH EVERY DAY 07/01/20   Reather Littler, MD  triamcinolone cream (KENALOG) 0.1 % Apply 1 application topically 2 (two) times daily. Patient not taking: Reported on 12/11/2020 10/28/20   Janeece Agee, NP  Vitamin D, Cholecalciferol, 50 MCG (2000 UT) CAPS Take by mouth.    [provider]    Allergies    Patient has no known allergies.  Review of Systems   Review of Systems  Musculoskeletal:  Positive for joint swelling and myalgias. Negative for arthralgias.  Skin:  Positive for color change. Negative for wound.  Neurological:  Negative for weakness and numbness.   Physical Exam Updated Vital Signs BP 102/73 (BP Location: Right Arm)   Pulse (!) 49   Temp 98.3 F (36.8 C) (Oral)   Resp 19   Ht 5\' 5"  (1.651 m)   Wt 65.8 kg   LMP 12/14/2011   SpO2 99%   BMI 24.13 kg/m   Physical Exam Vitals and nursing note reviewed.  Constitutional:      General: She is not in acute distress.    Appearance: She is well-developed.  HENT:     Head: Normocephalic and atraumatic.     Right  Ear: External ear normal.     Left Ear: External ear normal.  Eyes:     General: No scleral icterus.       Right eye: No discharge.        Left eye: No discharge.     Conjunctiva/sclera: Conjunctivae normal.  Neck:     Trachea: No tracheal deviation.  Cardiovascular:     Rate and Rhythm: Normal rate.  Pulmonary:     Effort: Pulmonary effort is normal. No respiratory distress.     Breath sounds: No stridor.  Abdominal:     General: There is no distension.  Musculoskeletal:        General: Swelling and tenderness present. No deformity.     Cervical back: Neck supple.     Comments: Moderate swelling and ecchymosis noted diffusely along the dorsum of the right foot and right ankle circumferentially.  Mild tenderness noted along the dorsum of the right foot along the second and  third metatarsals.  No tenderness appreciated in the toes.  No tenderness appreciated along the ankle circumferentially.  Full range of motion of the ankle.  Achilles tendon intact.  Distal sensation intact.  2+ pedal pulses.  Skin:    General: Skin is warm and dry.     Findings: Bruising present. No rash.  Neurological:     Mental Status: She is alert.     Cranial Nerves: Cranial nerve deficit: no gross deficits.    ED Results / Procedures / Treatments   Labs (all labs ordered are listed, but only abnormal results are displayed) Labs Reviewed - No data to display  EKG None  Radiology DG Foot Complete Right  Result Date: 12/22/2020 CLINICAL DATA:  Stepped in a hole.  Foot pain and swelling. EXAM: RIGHT FOOT COMPLETE - 3+ VIEW COMPARISON:  None. FINDINGS: No evidence for an acute fracture. No subluxation or dislocation. Advanced degenerative changes are noted in the MTP joint of the great toe with hallux valgus deformity. IMPRESSION: No acute bony abnormality. Advanced degenerative changes MTP joint of the great toe. Electronically Signed   By: Kennith Center M.D.   On: 12/22/2020 12:24    Procedures Procedures   Medications Ordered in ED Medications - No data to display  ED Course  I have reviewed the triage vital signs and the nursing notes.  Pertinent labs & imaging results that were available during my care of the patient were reviewed by me and considered in my medical decision making (see chart for details).    MDM Rules/Calculators/A&P                          Patient is a 60 year old female who presents to the emergency department with right foot pain, swelling, as well as bruising that began after stepping in a hole 2 days ago.  Patient is still ambulatory but reports mild pain with ambulation.  Full range of motion of the right ankle.  Neurovascularly intact in the foot.  X-rays were obtained of the right foot showing no acute bony abnormalities.  Discussed rice method  in length.  Offered patient a referral to sports medicine but she declined and states that she will follow-up with her PCP as needed.  Discussed return precautions.  Feel that she is stable for discharge at this time and she is agreeable.  Her questions were answered and she was amicable at the time of discharge.  Final Clinical Impression(s) / ED Diagnoses Final diagnoses:  Injury  Right foot pain    Rx / DC Orders ED Discharge Orders     None        Placido Sou, PA-C 12/22/20 1247    Jacalyn Lefevre, MD 12/22/20 1248

## 2020-12-22 NOTE — Discharge Instructions (Addendum)
Please continue to ice and elevate the foot at night.  This will help with swelling as well as inflammation.  I recommend light exercise and stretching as your pain tolerates.  Please follow-up with your regular doctor if your symptoms or not improving in the next 7 days.  You can always return to the emergency department if your symptoms worsen.  It was a pleasure to meet you.

## 2020-12-22 NOTE — ED Triage Notes (Addendum)
Pt states she stepped in a hole 2 days ago-pain to right foot-NAD-steady gait

## 2020-12-24 ENCOUNTER — Other Ambulatory Visit: Payer: Self-pay | Admitting: Endocrinology

## 2020-12-31 ENCOUNTER — Inpatient Hospital Stay (HOSPITAL_BASED_OUTPATIENT_CLINIC_OR_DEPARTMENT_OTHER): Admission: RE | Admit: 2020-12-31 | Payer: BC Managed Care – PPO | Source: Ambulatory Visit

## 2021-01-05 ENCOUNTER — Other Ambulatory Visit: Payer: Self-pay

## 2021-01-05 ENCOUNTER — Ambulatory Visit (INDEPENDENT_AMBULATORY_CARE_PROVIDER_SITE_OTHER): Payer: BC Managed Care – PPO | Admitting: Podiatry

## 2021-01-05 ENCOUNTER — Ambulatory Visit (INDEPENDENT_AMBULATORY_CARE_PROVIDER_SITE_OTHER): Payer: BC Managed Care – PPO

## 2021-01-05 DIAGNOSIS — S99921A Unspecified injury of right foot, initial encounter: Secondary | ICD-10-CM | POA: Diagnosis not present

## 2021-01-05 DIAGNOSIS — M2021 Hallux rigidus, right foot: Secondary | ICD-10-CM | POA: Diagnosis not present

## 2021-01-05 NOTE — Progress Notes (Signed)
Subjective:   Patient ID: Mariah Moss, female   DOB: 60 y.o.   MRN: [id number]   HPI Patient presents stating she traumatized her right foot and its been very sore and swollen and making it hard for her to walk and also she has had a very bad big toe joint right that is been going on for years and she had surgery on her left 1/20 years ago.  States that it is very tender within the joint and swollen and hard for her to do certain activities   Review of Systems  All other systems reviewed and are negative.      Objective:  Physical Exam Vitals and nursing note reviewed.  Constitutional:      Appearance: She is well-developed.  Pulmonary:     Effort: Pulmonary effort is normal.  Musculoskeletal:        General: Normal range of motion.  Skin:    General: Skin is warm.  Neurological:     Mental Status: She is alert.    Neurovascular status intact muscle strength adequate range of motion within normal limits with patient found to have swelling and soreness on the dorsum of the right foot lateral side with inflammation and also is noted to have a very enlarged big toe joint right with spur formation and complete loss of joint motion with history of having had surgery left 25 years ago which is doing reasonably well     Assessment:  Traumatized right foot with swelling with possibility for fracture or other not unknown pathology along with severe hallux limitus deformity right     Plan:  H&P reviewed condition and for the outside of the right foot recommended ice and the fact this will probably take 6-8 more weeks to heal completely.  Discussed big toe joint I think she could consider getting something done I do think she would do well with an implant even though there is elevation of the first metatarsal segment but I think it would do better than fusion and also be hard for her to take off time with fusion.  At this point I reviewed what would be required she is going to think  about it and decide when it will be best to do  X-ray indicates that there is narrowing of the joint surface right with significant arthritis spur formation no signs of fracture of the lateral foot

## 2021-01-05 NOTE — Patient Instructions (Signed)
Hallux Rigidus  Hallux rigidus is a type of joint pain or joint disease (arthritis) that affects your big toe (hallux). This condition involves the joint that connects the base of your big toe to the main part of your foot (metatarsophalangeal joint or MTP joint). This condition can cause your big toe to become stiff, painful, and difficult to move. Symptoms may get worse with movement or in cold or damp weather. Thecondition gets worse over time. What are the causes? This condition may be caused by having a foot that does not function the way that it should or that has an abnormal shape (structural deformity). These foot problems can run in families and may be passed down from parents to children (are hereditary). This condition can also be caused by: Injury. Overuse. Certain inflammatory diseases, including gout and rheumatoid arthritis. What increases the risk? You are more likely to develop this condition if you have: A foot bone (metatarsal) that is longer or higher than normal. A family history of hallux rigidus. Previously injured your big toe. Feet that do not have a curve (arch) on the inner side of the foot. This may be called flat feet or fallen arches. Ankles that turn in when you walk (pronation). Rheumatoid arthritis or gout. A job that requires you to stoop down often at work. What are the signs or symptoms? Symptoms of this condition include: Big toe pain. Stiffness and difficulty moving the big toe. Swelling of the toe and surrounding area. Bone spurs. These are bony growths that can form on the joint of the big toe. A limp. How is this diagnosed? This condition is diagnosed based on your medical history and a physical exam.You may also have X-rays. How is this treated? This condition is treated by: Wearing roomy, comfortable shoes that have a large toe box. Putting orthotic devices in your shoes. Taking pain medicines. Having physical therapy. Icing the injured  area. Alternating between putting your foot in cold water and then in warm water. If your condition is severe, treatment may include: Corticosteroid injections to relieve pain. Surgery to remove bone spurs, fuse damaged bones together, or replace the entire joint. Follow these instructions at home: Managing pain, stiffness, and swelling  Put your feet in cold water for 30 seconds, and then in warm water for 30 seconds. Alternate between the cold and warm water for 5 minutes. Do this several times a day or as told by your health care provider. If directed, put ice on the injured area. Put ice in a plastic bag. Place a towel between your skin and the bag. Leave the ice on for 20 minutes, 2-3 times a day.  General instructions Take over-the-counter and prescription medicines only as told by your health care provider. Do not wear high heels or other restrictive footwear. Wear comfortable, supportive shoes that have a large toe box. Wear shoe inserts (orthotics) as told by your health care provider, if this applies. Do foot exercises as instructed by your health care provider or a physical therapist. Keep all follow-up visits as told by your health care provider. This is important. Contact a health care provider if: You notice bone spurs or growths on or around your big toe. Your pain does not get better or it gets worse. You have pain while resting. You have pain in other parts of your body, such as your back, hip, or knee. You start to limp. Summary Hallux rigidus is a condition that makes your big toe become stiff, painful,   and difficult to move. It can be caused by injury, overuse, or inflammatory diseases. This condition may be treated with ice, medicines, physical therapy, and surgery. Do not wear high heels or other restrictive footwear. Wear comfortable, supportive shoes that have a large toe box. This information is not intended to replace advice given to you by your health care  provider. Make sure you discuss any questions you have with your healthcare provider. Document Revised: 02/17/2018 Document Reviewed: 02/20/2018 Elsevier Patient Education  2022 Elsevier Inc.  

## 2021-01-25 ENCOUNTER — Other Ambulatory Visit: Payer: Self-pay | Admitting: Family Medicine

## 2021-03-16 NOTE — Telephone Encounter (Signed)
This concern has been previously addressed by myself and/or another provider.  If they patient has ongoing concerns, they can contact me at their convenience.  Thank you,  Rich Thao Vanover, NP 

## 2021-03-18 ENCOUNTER — Other Ambulatory Visit: Payer: Self-pay | Admitting: Family Medicine

## 2021-03-25 ENCOUNTER — Telehealth: Payer: Self-pay | Admitting: Endocrinology

## 2021-03-25 NOTE — Telephone Encounter (Signed)
Called patient and gave her the correct number for Bone density on [facility]. She will call and schedule.

## 2021-03-25 NOTE — Telephone Encounter (Signed)
Pt was given Bone Density appt, where should she go for her exam? Pt has [employer] ins. Went to [facility] last density. Was told twice to go back to 2020's location or risk insurance not paying for it. Pt contact [phone]

## 2021-03-26 ENCOUNTER — Other Ambulatory Visit: Payer: Self-pay | Admitting: Endocrinology

## 2021-03-26 ENCOUNTER — Telehealth: Payer: Self-pay | Admitting: Endocrinology

## 2021-03-26 DIAGNOSIS — E213 Hyperparathyroidism, unspecified: Secondary | ICD-10-CM

## 2021-03-26 DIAGNOSIS — M85851 Other specified disorders of bone density and structure, right thigh: Secondary | ICD-10-CM

## 2021-03-26 DIAGNOSIS — M85852 Other specified disorders of bone density and structure, left thigh: Secondary | ICD-10-CM

## 2021-03-26 NOTE — Telephone Encounter (Signed)
[facility] called and requested a new bone density order. Pt was originally scheduled for [facility], but came to [facility].

## 2021-03-27 NOTE — Telephone Encounter (Signed)
Called Mariah Moss verified order received and patient is scheduled for 04/02/20.

## 2021-04-02 ENCOUNTER — Other Ambulatory Visit: Payer: Self-pay

## 2021-04-02 ENCOUNTER — Ambulatory Visit (INDEPENDENT_AMBULATORY_CARE_PROVIDER_SITE_OTHER)
Admission: RE | Admit: 2021-04-02 | Discharge: 2021-04-02 | Disposition: A | Discharge status: Home / Self Care | Payer: BC Managed Care – PPO | Source: Ambulatory Visit | Attending: Endocrinology | Admitting: Endocrinology

## 2021-04-02 DIAGNOSIS — E213 Hyperparathyroidism, unspecified: Secondary | ICD-10-CM | POA: Diagnosis not present

## 2021-04-02 DIAGNOSIS — M85852 Other specified disorders of bone density and structure, left thigh: Secondary | ICD-10-CM

## 2021-04-02 DIAGNOSIS — M85851 Other specified disorders of bone density and structure, right thigh: Secondary | ICD-10-CM

## 2021-04-03 DIAGNOSIS — M85851 Other specified disorders of bone density and structure, right thigh: Secondary | ICD-10-CM | POA: Diagnosis not present

## 2021-04-03 DIAGNOSIS — E213 Hyperparathyroidism, unspecified: Secondary | ICD-10-CM | POA: Diagnosis not present

## 2021-04-03 DIAGNOSIS — M85852 Other specified disorders of bone density and structure, left thigh: Secondary | ICD-10-CM | POA: Diagnosis not present

## 2021-04-08 NOTE — Progress Notes (Signed)
Bone density is minimally reduced from 2020 but adjusted for age is about the same.  No further action needed

## 2021-05-01 ENCOUNTER — Other Ambulatory Visit: Payer: Self-pay | Admitting: Family Medicine

## 2021-05-07 ENCOUNTER — Ambulatory Visit (AMBULATORY_SURGERY_CENTER): Payer: Self-pay | Admitting: *Deleted

## 2021-05-07 ENCOUNTER — Other Ambulatory Visit: Payer: Self-pay

## 2021-05-07 VITALS — Ht 64.0 in | Wt 138.0 lb

## 2021-05-07 DIAGNOSIS — Z1211 Encounter for screening for malignant neoplasm of colon: Secondary | ICD-10-CM

## 2021-05-07 MED ORDER — NA SULFATE-K SULFATE-MG SULF 17.5-3.13-1.6 GM/177ML PO SOLN: 1.0000 | ORAL | 0 refills | Status: DC

## 2021-05-07 NOTE — Progress Notes (Signed)
Patient is here in-person for PV. Patient denies any allergies to eggs or soy. Patient denies any problems with anesthesia/sedation. Patient is not on any oxygen at home. Patient is not taking any diet/weight loss medications or blood thinners. Patient is aware of our care-partner policy and Covid-19 safety protocol.   EMMI education assigned to the patient for the procedure, sent to MyChart.   Patient is COVID-19 vaccinated.  

## 2021-05-08 ENCOUNTER — Encounter: Payer: Self-pay | Admitting: Gastroenterology

## 2021-05-21 ENCOUNTER — Encounter: Payer: Self-pay | Admitting: Gastroenterology

## 2021-05-21 ENCOUNTER — Other Ambulatory Visit: Payer: Self-pay

## 2021-05-21 ENCOUNTER — Ambulatory Visit (AMBULATORY_SURGERY_CENTER): Payer: BC Managed Care – PPO | Admitting: Gastroenterology

## 2021-05-21 VITALS — BP 127/68 | HR 46 | Temp 96.8°F | Resp 14 | Ht 64.0 in | Wt 138.0 lb

## 2021-05-21 DIAGNOSIS — Z1211 Encounter for screening for malignant neoplasm of colon: Secondary | ICD-10-CM

## 2021-05-21 MED ORDER — SODIUM CHLORIDE 0.9 % IV SOLN: 500.0000 mL | INTRAVENOUS | Status: DC

## 2021-05-21 NOTE — Progress Notes (Signed)
Pt's states no medical or surgical changes since previsit or office visit. 

## 2021-05-21 NOTE — Progress Notes (Signed)
PT taken to PACU. Monitors in place. VSS. Report given to RN. 

## 2021-05-21 NOTE — Op Note (Signed)
[facility] Patient Name: Mariah Moss Procedure Date: 05/21/2021 7:10 AM MRN: [id number] Endoscopist: Sherilyn Cooter L. Myrtie Neither , MD Age: 60 Referring MD:  Date of Birth: 02/05/1961 Gender: Female Account #: [id number] Procedure:                Colonoscopy Indications:              Screening for colorectal malignant neoplasm                           normal colonoscopy 2011 Medicines:                Monitored Anesthesia Care Procedure:                Pre-Anesthesia Assessment:                           - Prior to the procedure, a History and Physical                            was performed, and patient medications and                            allergies were reviewed. The patient's tolerance of                            previous anesthesia was also reviewed. The risks                            and benefits of the procedure and the sedation                            options and risks were discussed with the patient.                            All questions were answered, and informed consent                            was obtained. Prior Anticoagulants: The patient has                            taken no previous anticoagulant or antiplatelet                            agents. ASA Grade Assessment: II - A patient with                            mild systemic disease. After reviewing the risks                            and benefits, the patient was deemed in                            satisfactory condition to undergo the procedure.  After obtaining informed consent, the colonoscope                            was passed under direct vision. Throughout the                            procedure, the patient's blood pressure, pulse, and                            oxygen saturations were monitored continuously. The                            Olympus PCF-[id number] (#[id number]) Colonoscope was                            introduced through the anus and advanced to the  the                            cecum, identified by appendiceal orifice and                            ileocecal valve. The colonoscopy was performed                            without difficulty. The patient tolerated the                            procedure well. The quality of the bowel                            preparation was excellent. The ileocecal valve,                            appendiceal orifice, and rectum were photographed.                            The bowel preparation used was SUPREP. Scope In: 8:08:14 AM Scope Out: 8:21:14 AM Scope Withdrawal Time: 0 hours 9 minutes 57 seconds  Total Procedure Duration: 0 hours 13 minutes 0 seconds  Findings:                 The perianal and digital rectal examinations were                            normal.                           The entire examined colon appeared normal on direct                            and retroflexion views. Complications:            No immediate complications. Estimated Blood Loss:     Estimated blood loss: none. Impression:               - The entire examined colon is normal  on direct and                            retroflexion views.                           - No specimens collected. Recommendation:           - Patient has a contact number available for                            emergencies. The signs and symptoms of potential                            delayed complications were discussed with the                            patient. Return to normal activities tomorrow.                            Written discharge instructions were provided to the                            patient.                           - Resume previous diet.                           - Continue present medications.                           - Repeat colonoscopy in 10 years for screening                            purposes. Mariah Moss [facility] L. Myrtie Neither, MD 05/21/2021 8:25:09 AM This report has been signed electronically.

## 2021-05-21 NOTE — Progress Notes (Signed)
History and Physical:  This patient presents for endoscopic testing for: Encounter Diagnosis  Name Primary?   Special screening for malignant neoplasms, colon Yes    Last colonoscopy 2011 Patient denies chronic abdominal pain, rectal bleeding, constipation or diarrhea.   ROS: Patient denies chest pain or cough   Past Medical History: Past Medical History:  Diagnosis Date   Anxiety    Heart murmur    Hypertension      Past Surgical History: Past Surgical History:  Procedure Laterality Date   BUNIONECTOMY  05/24/1998   COLONOSCOPY  2011   in [place], [place] -Normal exam    Allergies: No Known Allergies  Outpatient Meds: Current Outpatient Medications  Medication Sig Dispense Refill   citalopram (CELEXA) 20 MG tablet TAKE 1 TABLET BY MOUTH EVERY DAY 90 tablet 0   hydrochlorothiazide (HYDRODIURIL) 12.5 MG tablet TAKE 1 TABLET BY MOUTH EVERY DAY 90 tablet 1   Na Sulfate-K Sulfate-Mg Sulf 17.5-3.13-1.6 GM/177ML SOLN Take 1 kit by mouth as directed. May use generic Suprep 354 mL 0   raloxifene (EVISTA) 60 MG tablet TAKE 1 TABLET BY MOUTH EVERY DAY 90 tablet 1   Vitamin D, Cholecalciferol, 50 MCG (2000 UT) CAPS Take by mouth.     Current Facility-Administered Medications  Medication Dose Route Frequency Provider Last Rate Last Admin   0.9 %  sodium chloride infusion  500 mL Intravenous Continuous Danis, Estill Cotta III, MD          ___________________________________________________________________ Objective   Exam:  BP 132/87    Pulse (!) 55    Temp (!) 96.8 F (36 C) (Temporal)    Resp 18    Ht _0  (1.626 m)    Wt 138 lb (62.6 kg)    LMP 12/14/2011    SpO2 98%    BMI 23.69 kg/m   CV: RRR without murmur, S1/S2 Resp: clear to auscultation bilaterally, normal RR and effort noted GI: soft, no tenderness, with active bowel sounds.   Assessment: Encounter Diagnosis  Name Primary?   Special screening for malignant neoplasms, colon Yes      Plan: Colonoscopy  The benefits and risks of the planned procedure were described in detail with the patient or (when appropriate) their health care proxy.  Risks were outlined as including, but not limited to, bleeding, infection, perforation, adverse medication reaction leading to cardiac or pulmonary decompensation, pancreatitis (if ERCP).  The limitation of incomplete mucosal visualization was also discussed.  No guarantees or warranties were given.    The patient is appropriate for an endoscopic procedure in the ambulatory setting.   - Wilfrid Lund, MD

## 2021-05-21 NOTE — Patient Instructions (Signed)
YOU HAD AN ENDOSCOPIC PROCEDURE TODAY AT THE [facility]:   Refer to the procedure report that was given to you for any specific questions about what was found during the examination.  If the procedure report does not answer your questions, please call your gastroenterologist to clarify.  If you requested that your care partner not be given the details of your procedure findings, then the procedure report has been included in a sealed envelope for you to review at your convenience later.  YOU SHOULD EXPECT: Some feelings of bloating in the abdomen. Passage of more gas than usual.  Walking can help get rid of the air that was put into your GI tract during the procedure and reduce the bloating. If you had a lower endoscopy (such as a colonoscopy or flexible sigmoidoscopy) you may notice spotting of blood in your stool or on the toilet paper. If you underwent a bowel prep for your procedure, you may not have a normal bowel movement for a few days.  Please Note:  You might notice some irritation and congestion in your nose or some drainage.  This is from the oxygen used during your procedure.  There is no need for concern and it should clear up in a day or so.  SYMPTOMS TO REPORT IMMEDIATELY:   Following lower endoscopy (colonoscopy or flexible sigmoidoscopy):  Excessive amounts of blood in the stool  Significant tenderness or worsening of abdominal pains  Swelling of the abdomen that is new, acute  Fever of 100F or higher  For urgent or emergent issues, a gastroenterologist can be reached at any hour by calling [phone]. Do not use MyChart messaging for urgent concerns.    DIET:  We do recommend a small meal at first, but then you may proceed to your regular diet.  Drink plenty of fluids but you should avoid alcoholic beverages for 24 hours.  ACTIVITY:  You should plan to take it easy for the rest of today and you should NOT DRIVE or use heavy machinery until tomorrow (because  of the sedation medicines used during the test).    FOLLOW UP: Our staff will call the number listed on your records 48-72 hours following your procedure to check on you and address any questions or concerns that you may have regarding the information given to you following your procedure. If we do not reach you, we will leave a message.  We will attempt to reach you two times.  During this call, we will ask if you have developed any symptoms of COVID 19. If you develop any symptoms (ie: fever, flu-like symptoms, shortness of breath, cough etc.) before then, please call [phone].  If you test positive for Covid 19 in the 2 weeks post procedure, please call and report this information to us.    If any biopsies were taken you will be contacted by phone or by letter within the next 1-3 weeks.  Please call us at [phone] if you have not heard about the biopsies in 3 weeks.    SIGNATURES/CONFIDENTIALITY: You and/or your care partner have signed paperwork which will be entered into your electronic medical record.  These signatures attest to the fact that that the information above on your After Visit Summary has been reviewed and is understood.  Full responsibility of the confidentiality of this discharge information lies with you and/or your care-partner. 

## 2021-05-27 ENCOUNTER — Telehealth: Payer: Self-pay

## 2021-05-27 NOTE — Telephone Encounter (Signed)
Left message on answering machine. 

## 2021-07-07 ENCOUNTER — Other Ambulatory Visit: Payer: Self-pay | Admitting: Endocrinology

## 2021-07-08 ENCOUNTER — Other Ambulatory Visit: Payer: Self-pay | Admitting: Family Medicine

## 2021-07-08 DIAGNOSIS — Z1231 Encounter for screening mammogram for malignant neoplasm of breast: Secondary | ICD-10-CM

## 2021-07-09 DIAGNOSIS — Z01419 Encounter for gynecological examination (general) (routine) without abnormal findings: Secondary | ICD-10-CM | POA: Diagnosis not present

## 2021-07-09 DIAGNOSIS — Z780 Asymptomatic menopausal state: Secondary | ICD-10-CM | POA: Diagnosis not present

## 2021-07-09 DIAGNOSIS — R030 Elevated blood-pressure reading, without diagnosis of hypertension: Secondary | ICD-10-CM | POA: Diagnosis not present

## 2021-07-09 DIAGNOSIS — Z130 Encounter for screening for diseases of the blood and blood-forming organs and certain disorders involving the immune mechanism: Secondary | ICD-10-CM | POA: Diagnosis not present

## 2021-07-22 ENCOUNTER — Other Ambulatory Visit: Payer: Self-pay

## 2021-07-22 ENCOUNTER — Ambulatory Visit
Admission: RE | Admit: 2021-07-22 | Discharge: 2021-07-22 | Disposition: A | Discharge status: Home / Self Care | Payer: BC Managed Care – PPO | Source: Ambulatory Visit | Attending: Family Medicine | Admitting: Family Medicine

## 2021-07-22 DIAGNOSIS — Z1231 Encounter for screening mammogram for malignant neoplasm of breast: Secondary | ICD-10-CM | POA: Diagnosis not present

## 2021-07-28 ENCOUNTER — Other Ambulatory Visit: Payer: Self-pay | Admitting: Family Medicine

## 2021-08-27 ENCOUNTER — Ambulatory Visit (INDEPENDENT_AMBULATORY_CARE_PROVIDER_SITE_OTHER): Payer: BC Managed Care – PPO | Admitting: Family Medicine

## 2021-08-27 ENCOUNTER — Encounter: Payer: Self-pay | Admitting: Family Medicine

## 2021-08-27 VITALS — BP 122/78 | HR 55 | Temp 97.5°F | Resp 16 | Ht 64.5 in | Wt 135.2 lb

## 2021-08-27 DIAGNOSIS — Z0184 Encounter for antibody response examination: Secondary | ICD-10-CM

## 2021-08-27 DIAGNOSIS — Z0000 Encounter for general adult medical examination without abnormal findings: Secondary | ICD-10-CM | POA: Diagnosis not present

## 2021-08-27 DIAGNOSIS — E213 Hyperparathyroidism, unspecified: Secondary | ICD-10-CM

## 2021-08-27 DIAGNOSIS — I10 Essential (primary) hypertension: Secondary | ICD-10-CM | POA: Diagnosis not present

## 2021-08-27 LAB — LIPID PANEL
Cholesterol: 191 mg/dL (ref 0–200)
HDL: 80.5 mg/dL (ref >39.00)
LDL Cholesterol: 99 mg/dL (ref 0–99)
NonHDL: 110.61
Total CHOL/HDL Ratio: 2
Triglycerides: 56 mg/dL (ref 0.0–149.0)
VLDL: 11.2 mg/dL (ref 0.0–40.0)

## 2021-08-27 LAB — HEPATIC FUNCTION PANEL
ALT: 15 U/L (ref 0–35)
AST: 21 U/L (ref 0–37)
Albumin: 4.1 g/dL (ref 3.5–5.2)
Alkaline Phosphatase: 61 U/L (ref 39–117)
Bilirubin, Direct: 0.1 mg/dL (ref 0.0–0.3)
Total Bilirubin: 0.6 mg/dL (ref 0.2–1.2)
Total Protein: 6.4 g/dL (ref 6.0–8.3)

## 2021-08-27 LAB — BASIC METABOLIC PANEL
BUN: 27 mg/dL — ABNORMAL HIGH (ref 6–23)
CO2: 30 mEq/L (ref 19–32)
Calcium: 11.3 mg/dL — ABNORMAL HIGH (ref 8.4–10.5)
Chloride: 103 mEq/L (ref 96–112)
Creatinine, Ser: 1.08 mg/dL (ref 0.40–1.20)
GFR: 55.36 mL/min — ABNORMAL LOW (ref >60.00)
Glucose, Bld: 52 mg/dL — ABNORMAL LOW (ref 70–99)
Potassium: 3.9 mEq/L (ref 3.5–5.1)
Sodium: 139 mEq/L (ref 135–145)

## 2021-08-27 LAB — TSH: TSH: 3 u[IU]/mL (ref 0.35–5.50)

## 2021-08-27 LAB — CBC WITH DIFFERENTIAL/PLATELET
Basophils Absolute: 0.1 10*3/uL (ref 0.0–0.1)
Basophils Relative: 3.3 % — ABNORMAL HIGH (ref 0.0–3.0)
Eosinophils Absolute: 0.1 10*3/uL (ref 0.0–0.7)
Eosinophils Relative: 2.8 % (ref 0.0–5.0)
HCT: 45.8 % (ref 36.0–46.0)
Hemoglobin: 15.1 g/dL — ABNORMAL HIGH (ref 12.0–15.0)
Lymphocytes Relative: 34.8 % (ref 12.0–46.0)
Lymphs Abs: 1.3 10*3/uL (ref 0.7–4.0)
MCHC: 33.1 g/dL (ref 30.0–36.0)
MCV: 99 fl (ref 78.0–100.0)
Monocytes Absolute: 0.4 10*3/uL (ref 0.1–1.0)
Monocytes Relative: 11.5 % (ref 3.0–12.0)
Neutro Abs: 1.7 10*3/uL (ref 1.4–7.7)
Neutrophils Relative %: 47.6 % (ref 43.0–77.0)
Platelets: 180 10*3/uL (ref 150.0–400.0)
RBC: 4.63 Mil/uL (ref 3.87–5.11)
RDW: 14.3 % (ref 11.5–15.5)
WBC: 3.6 10*3/uL — ABNORMAL LOW (ref 4.0–10.5)

## 2021-08-27 LAB — VITAMIN D 25 HYDROXY (VIT D DEFICIENCY, FRACTURES): VITD: 57.67 ng/mL (ref 30.00–100.00)

## 2021-08-27 NOTE — Patient Instructions (Addendum)
Follow up in 1 year or as needed We'll notify you of your lab results and make any changes if needed Keep up the good work on healthy diet and regular exercise- you look great!!! Call with any questions or concerns Stay Safe!  Stay Healthy! Happy Spring!!! 

## 2021-08-27 NOTE — Assessment & Plan Note (Signed)
Currently well controlled on HCTZ.  Check labs due to diuretic use.  No anticipated med changes. ?

## 2021-08-27 NOTE — Progress Notes (Signed)
? ?  Subjective:  ? ? Patient ID: Mariah Moss, female    DOB: 26-Sep-1960, 61 y.o.   MRN: [id number] ? ?HPI ?CPE- UTD on mammo, pap, Tdap, colonoscopy. ? ?Patient Care Team  ?  Relationship Specialty Notifications Start End  ?Midge Minium, MD PCP - General Family Medicine  10/14/11   ?Key, Nelia Shi, NP Nurse Practitioner Gynecology  12/09/14   ?  ?Health Maintenance  ?Topic Date Due  ? Zoster Vaccines- Shingrix (1 of 2) Never done  ? COVID-19 Vaccine (4 - Booster for Pfizer series) 07/14/2020  ? INFLUENZA VACCINE  12/22/2021  ? MAMMOGRAM  07/23/2022  ? PAP SMEAR-Modifier  07/03/2023  ? TETANUS/TDAP  12/08/2024  ? COLONOSCOPY (Pts 45-9yrs Insurance coverage will need to be confirmed)  05/22/2031  ? HPV VACCINES  Aged Out  ? Hepatitis C Screening  Discontinued  ? HIV Screening  Discontinued  ?  ? ? ?Review of Systems ?Patient reports no vision/ hearing changes, adenopathy,fever, weight change,  persistant/recurrent hoarseness , swallowing issues, chest pain, palpitations, edema, persistant/recurrent cough, hemoptysis, dyspnea (rest/exertional/paroxysmal nocturnal), gastrointestinal bleeding (melena, rectal bleeding), abdominal pain, significant heartburn, bowel changes, GU symptoms (dysuria, hematuria, incontinence), Gyn symptoms (abnormal  bleeding, pain),  syncope, focal weakness, memory loss, numbness & tingling, skin/hair/nail changes, abnormal bruising or bleeding, anxiety, or depression.  ?   ?Objective:  ? Physical Exam ?General Appearance:    Alert, cooperative, no distress, appears stated age  ?Head:    Normocephalic, without obvious abnormality, atraumatic  ?Eyes:    PERRL, conjunctiva/corneas clear, EOM's intact both eyes  ?Ears:    Normal TM's and external ear canals, both ears  ?Nose:   Nares normal, septum midline, mucosa normal, no drainage  ?  or sinus tenderness  ?Throat:   Lips, mucosa, and tongue normal; teeth and gums normal  ?Neck:   Supple, symmetrical, trachea midline, no adenopathy;  ?   Thyroid: no enlargement/tenderness/nodules  ?Back:     Symmetric, no curvature, ROM normal, no CVA tenderness  ?Lungs:     Clear to auscultation bilaterally, respirations unlabored  ?Chest Wall:    No tenderness or deformity  ? Heart:    Regular rate and rhythm, S1 and S2 normal, no murmur, rub ?  or gallop  ?Breast Exam:    Deferred to GYN  ?Abdomen:     Soft, non-tender, bowel sounds active all four quadrants,  ?  no masses, no organomegaly  ?Genitalia:    Deferred to GYN  ?Rectal:    ?Extremities:   Extremities normal, atraumatic, no cyanosis or edema  ?Pulses:   2+ and symmetric all extremities  ?Skin:   Skin color, texture, turgor normal, no rashes or lesions  ?Lymph nodes:   Cervical, supraclavicular, and axillary nodes normal  ?Neurologic:   CNII-XII intact, normal strength, sensation and reflexes  ?  throughout  ?  ? ? ? ?   ?Assessment & Plan:  ? ? ?

## 2021-08-27 NOTE — Assessment & Plan Note (Signed)
Check Vit D level 

## 2021-08-27 NOTE — Assessment & Plan Note (Signed)
Pt's PE WNL.  UTD on Tdap, mammo, pap, colonoscopy.  Check labs.  Anticipatory guidance provided.  ?

## 2021-08-28 LAB — VARICELLA ZOSTER ANTIBODY, IGG: Varicella IgG: 2051 index

## 2021-09-18 ENCOUNTER — Other Ambulatory Visit: Payer: Self-pay | Admitting: Family Medicine

## 2021-09-23 DIAGNOSIS — Z1283 Encounter for screening for malignant neoplasm of skin: Secondary | ICD-10-CM | POA: Diagnosis not present

## 2021-09-23 DIAGNOSIS — D225 Melanocytic nevi of trunk: Secondary | ICD-10-CM | POA: Diagnosis not present

## 2021-10-27 ENCOUNTER — Other Ambulatory Visit: Payer: Self-pay | Admitting: Family Medicine

## 2021-12-08 ENCOUNTER — Other Ambulatory Visit (INDEPENDENT_AMBULATORY_CARE_PROVIDER_SITE_OTHER): Payer: BC Managed Care – PPO

## 2021-12-08 DIAGNOSIS — M85852 Other specified disorders of bone density and structure, left thigh: Secondary | ICD-10-CM

## 2021-12-08 DIAGNOSIS — E213 Hyperparathyroidism, unspecified: Secondary | ICD-10-CM | POA: Diagnosis not present

## 2021-12-08 DIAGNOSIS — M85851 Other specified disorders of bone density and structure, right thigh: Secondary | ICD-10-CM

## 2021-12-08 DIAGNOSIS — E049 Nontoxic goiter, unspecified: Secondary | ICD-10-CM | POA: Diagnosis not present

## 2021-12-08 LAB — BASIC METABOLIC PANEL
BUN: 23 mg/dL (ref 6–23)
CO2: 27 mEq/L (ref 19–32)
Calcium: 11.1 mg/dL — ABNORMAL HIGH (ref 8.4–10.5)
Chloride: 107 mEq/L (ref 96–112)
Creatinine, Ser: 1.09 mg/dL (ref 0.40–1.20)
GFR: 54.64 mL/min — ABNORMAL LOW (ref >60.00)
Glucose, Bld: 91 mg/dL (ref 70–99)
Potassium: 4.3 mEq/L (ref 3.5–5.1)
Sodium: 139 mEq/L (ref 135–145)

## 2021-12-08 LAB — TSH: TSH: 4.2 u[IU]/mL (ref 0.35–5.50)

## 2021-12-08 LAB — VITAMIN D 25 HYDROXY (VIT D DEFICIENCY, FRACTURES): VITD: 60.28 ng/mL (ref 30.00–100.00)

## 2021-12-08 LAB — T4, FREE: Free T4: 0.73 ng/dL (ref 0.60–1.60)

## 2021-12-11 ENCOUNTER — Encounter: Payer: Self-pay | Admitting: Endocrinology

## 2021-12-11 ENCOUNTER — Ambulatory Visit: Payer: BC Managed Care – PPO | Admitting: Endocrinology

## 2021-12-11 ENCOUNTER — Ambulatory Visit (INDEPENDENT_AMBULATORY_CARE_PROVIDER_SITE_OTHER): Payer: BC Managed Care – PPO | Admitting: Endocrinology

## 2021-12-11 VITALS — BP 128/90 | HR 54 | Ht 64.5 in | Wt 135.6 lb

## 2021-12-11 DIAGNOSIS — M85852 Other specified disorders of bone density and structure, left thigh: Secondary | ICD-10-CM | POA: Diagnosis not present

## 2021-12-11 DIAGNOSIS — M85851 Other specified disorders of bone density and structure, right thigh: Secondary | ICD-10-CM

## 2021-12-11 DIAGNOSIS — E049 Nontoxic goiter, unspecified: Secondary | ICD-10-CM | POA: Diagnosis not present

## 2021-12-11 DIAGNOSIS — E213 Hyperparathyroidism, unspecified: Secondary | ICD-10-CM | POA: Diagnosis not present

## 2021-12-11 NOTE — Progress Notes (Unsigned)
Patient ID: Mariah Moss, female   DOB: February 13, 1961, 61 y.o.   MRN: [id number]           Chief complaint: Endocrinology follow-up   Referring physician: Birdie Riddle  History of Present Illness:  HYPERCALCEMIA  She was seen in initial consultation for hypercalcemia in 11/2016   Review of records show that she has had a high calcium since 2013 and this has been variable Highest calcium has been 11.3 and now 11.1  Her HCTZ was reduced to half a tablet in 2018  She has been asymptomatic with no history of kidney stones or fractures No height loss Has osteopenia as discussed below   Lab Results  Component Value Date   CALCIUM 11.1 (H) 12/08/2021   CALCIUM 11.3 (H) 08/27/2021   CALCIUM 10.6 (H) 12/08/2020   CALCIUM 10.6 (H) 08/26/2020   CALCIUM 11.3 (H) 12/11/2019   CALCIUM 10.3 07/11/2019   CALCIUM 10.9 (H) 06/06/2019   CALCIUM 10.5 12/11/2018   CALCIUM 10.8 (H) 06/01/2018    Lab Results  Component Value Date   PTH 120 (H) 11/09/2016   CALCIUM 11.1 (H) 12/08/2021      OSTEOPENIA:   Menopause at occurred at about age 27  She was started on Evista for her hip osteopenia with baseline T score -1.8 She has been taking this since June 2019 Does not complain of any hot flashes She does exercise with both aerobic and some weights  Bone density is minimally lower at the femoral neck at -2.0  Results:  04/02/2021 Lumbar spine L1-L4(L3) Femoral neck (FN)  T-score  -0.8 RFN: -1.8 LFN: -2.0  Change in BMD from previous DXA test (%)  -2%   Although her bone density in 2020 is relatively stable it was relatively lower in the radius   Lumbar spine L1-L4 (L3) Femoral neck (FN) 33% distal radius Ultra distal radius  T-score -0.8 RFN: -1.8 LFN: -1.9 -1.8 -2.5  Change in BMD from previous DXA test (%)  0.0%  -0.6%  -4.4% n/a    Baseline bone density done on 12/21/16 results:    Lumbar spine L1-L4 (L3) Femoral neck (FN) 33% distal R radius  T-score -0.8 RFN: -1.8 LFN: -1.7 -1.5      VITAMIN D deficiency:  25 (OH) Vitamin D level was low before starting supplementation  She is taking 2000 units of vitamin D3 with the following results  Lab Results  Component Value Date   VD25OH 60.28 12/08/2021   VD25OH 57.67 08/27/2021   VD25OH 45.24 08/26/2020   VD25OH 53.00 12/11/2019    THYROID history: See review of systems   Allergies as of 12/11/2021   No Known Allergies      Medication List        Accurate as of December 11, 2021 11:59 PM. If you have any questions, ask your nurse or doctor.          citalopram 20 MG tablet Commonly known as: CELEXA TAKE 1 TABLET BY MOUTH EVERY DAY   hydrochlorothiazide 12.5 MG tablet Commonly known as: HYDRODIURIL TAKE 1 TABLET BY MOUTH EVERY DAY   Na Sulfate-K Sulfate-Mg Sulf 17.5-3.13-1.6 GM/177ML Soln Take 1 kit by mouth as directed. May use generic Suprep   raloxifene 60 MG tablet Commonly known as: EVISTA TAKE 1 TABLET BY MOUTH EVERY DAY   vitamin D3 50 MCG (2000 UT) Caps Take by mouth.        Allergies: No Known Allergies  Past Medical History:  Diagnosis Date   Anxiety  Heart murmur    Hypertension     Past Surgical History:  Procedure Laterality Date   BUNIONECTOMY  05/24/1998   COLONOSCOPY  2011   in [place], [place] -Normal exam    Family History  Problem Relation Age of Onset   Heart disease Mother    Stroke Mother    Hypertension Mother    Cancer Father    Diabetes Father    Thyroid disease Neg Hx    Colon cancer Neg Hx    Esophageal cancer Neg Hx    Stomach cancer Neg Hx     Social History:  reports that she has never smoked. She has never used smokeless tobacco. She reports current alcohol use of about 3.0 standard drinks of alcohol per week. She reports that she does not use drugs.  Review of Systems  Long-standing history of hypertension, treated with 25 mg for several years Controlled with 12.5 mg of hydrochlorothiazide  BP Readings from Last 3 Encounters:   12/11/21 128/90  08/27/21 122/78  05/21/21 127/68    Subclinical hypothyroidism:   Previously had been found to have a slightly TSH by PCP  She also was felt to have a goiter on her initial exam in 7/18 but ultrasound showed only heterogenous thyroid enlargement and no nodules  In 12/18 at her annual exam she was recommended starting 50 g of levothyroxine by her PCP She has been off her levothyroxine supplement since 05/2017 since she did not feel any different after a trial of supplement  No complaints of fatigue TSH is consistently normal    Lab Results  Component Value Date   TSH 4.20 12/08/2021   TSH 3.00 08/27/2021   TSH 2.77 12/08/2020   FREET4 0.73 12/08/2021   FREET4 0.93 12/08/2020   FREET4 0.84 12/11/2019    Taking long-term Celexa for anxiety   EXAM:  BP 128/90 (BP Location: Left Arm, Patient Position: Sitting, Cuff Size: Normal)   Pulse (!) 54   Ht 5' 4.5" (1.638 m)   Wt 135 lb 9.6 oz (61.5 kg)   LMP 12/14/2011   SpO2 94%   BMI 22.92 kg/m    Right thyroid lobe is enlarged about 1-1/2-2 times normal, firm with slightly irregular texture  Left lobe is not clearly palpable   No abnormal prominence of the spinous projections of the spine at the thoracic and lumbar regions  Assessment/Plan:   HYPERCALCEMIA: She has had mild chronic hypercalcemia for several years She has primary hyperparathyroidism confirmed on her high PTH level previously Calcium is consistently under 11.5 with some fluctuation She still does take 12.5 mg HCTZ  Since she does not have any significant osteoporosis or kidney stones she will likely need observation only   OSTEOPENIA: She is being treated with Evista since 2019 Bone density was relatively stable last year and lowest T score - 2.0  Again discussed that Evista helps reduce fractures even though bone density may not increase  Vitamin D deficiency: Adequately replaced  THYROID enlargement possibly from Hashimoto's  thyroiditis Thyroid enlargement appears to be less than last year and not clearly palpable on the left TSH normal consistently   Follow-up visit in 1 year   There are no Patient Instructions on file for this visit.   [name] 12/13/2021, 11:49 AM

## 2022-01-02 ENCOUNTER — Other Ambulatory Visit: Payer: Self-pay | Admitting: Family Medicine

## 2022-02-17 ENCOUNTER — Other Ambulatory Visit: Payer: Self-pay | Admitting: Family Medicine

## 2022-03-09 ENCOUNTER — Other Ambulatory Visit: Payer: Self-pay | Admitting: Family Medicine

## 2022-03-09 ENCOUNTER — Other Ambulatory Visit: Payer: Self-pay | Admitting: Lab

## 2022-03-09 ENCOUNTER — Other Ambulatory Visit: Payer: Self-pay | Admitting: Endocrinology

## 2022-03-09 DIAGNOSIS — F419 Anxiety disorder, unspecified: Secondary | ICD-10-CM

## 2022-03-09 DIAGNOSIS — E039 Hypothyroidism, unspecified: Secondary | ICD-10-CM

## 2022-03-09 MED ORDER — CITALOPRAM HYDROBROMIDE 20 MG PO TABS: 20.0000 mg | ORAL_TABLET | Freq: Every day | ORAL | 0 refills | Status: DC

## 2022-03-09 MED ORDER — HYDROCHLOROTHIAZIDE 12.5 MG PO TABS: 12.5000 mg | ORAL_TABLET | Freq: Every day | ORAL | 1 refills | Status: DC

## 2022-03-19 DIAGNOSIS — M79671 Pain in right foot: Secondary | ICD-10-CM | POA: Diagnosis not present

## 2022-03-19 DIAGNOSIS — M2021 Hallux rigidus, right foot: Secondary | ICD-10-CM | POA: Diagnosis not present

## 2022-03-24 HISTORY — PX: OTHER SURGICAL HISTORY: SHX169

## 2022-04-13 DIAGNOSIS — G8918 Other acute postprocedural pain: Secondary | ICD-10-CM | POA: Diagnosis not present

## 2022-04-13 DIAGNOSIS — M2021 Hallux rigidus, right foot: Secondary | ICD-10-CM | POA: Diagnosis not present

## 2022-05-27 DIAGNOSIS — M2021 Hallux rigidus, right foot: Secondary | ICD-10-CM | POA: Diagnosis not present

## 2022-06-28 DIAGNOSIS — M2021 Hallux rigidus, right foot: Secondary | ICD-10-CM | POA: Diagnosis not present

## 2022-07-15 DIAGNOSIS — B308 Other viral conjunctivitis: Secondary | ICD-10-CM | POA: Diagnosis not present

## 2022-08-14 ENCOUNTER — Other Ambulatory Visit: Payer: Self-pay | Admitting: Family Medicine

## 2022-08-14 DIAGNOSIS — F419 Anxiety disorder, unspecified: Secondary | ICD-10-CM

## 2022-08-31 ENCOUNTER — Encounter: Payer: BC Managed Care – PPO | Admitting: Family Medicine

## 2022-09-11 ENCOUNTER — Other Ambulatory Visit: Payer: Self-pay | Admitting: Endocrinology

## 2022-09-29 ENCOUNTER — Ambulatory Visit (INDEPENDENT_AMBULATORY_CARE_PROVIDER_SITE_OTHER): Payer: BC Managed Care – PPO | Admitting: Family Medicine

## 2022-09-29 ENCOUNTER — Encounter: Payer: Self-pay | Admitting: Family Medicine

## 2022-09-29 VITALS — BP 122/80 | HR 54 | Temp 97.8°F | Resp 17 | Ht 64.5 in | Wt 140.2 lb

## 2022-09-29 DIAGNOSIS — I10 Essential (primary) hypertension: Secondary | ICD-10-CM | POA: Diagnosis not present

## 2022-09-29 DIAGNOSIS — Z0000 Encounter for general adult medical examination without abnormal findings: Secondary | ICD-10-CM | POA: Diagnosis not present

## 2022-09-29 DIAGNOSIS — E213 Hyperparathyroidism, unspecified: Secondary | ICD-10-CM | POA: Diagnosis not present

## 2022-09-29 LAB — BASIC METABOLIC PANEL
BUN: 22 mg/dL (ref 6–23)
CO2: 29 mEq/L (ref 19–32)
Calcium: 10.7 mg/dL — ABNORMAL HIGH (ref 8.4–10.5)
Chloride: 104 mEq/L (ref 96–112)
Creatinine, Ser: 1.09 mg/dL (ref 0.40–1.20)
GFR: 54.33 mL/min — ABNORMAL LOW (ref >60.00)
Glucose, Bld: 88 mg/dL (ref 70–99)
Potassium: 4.1 mEq/L (ref 3.5–5.1)
Sodium: 139 mEq/L (ref 135–145)

## 2022-09-29 LAB — LIPID PANEL
Cholesterol: 184 mg/dL (ref 0–200)
HDL: 79.4 mg/dL (ref >39.00)
LDL Cholesterol: 95 mg/dL (ref 0–99)
NonHDL: 104.46
Total CHOL/HDL Ratio: 2
Triglycerides: 47 mg/dL (ref 0.0–149.0)
VLDL: 9.4 mg/dL (ref 0.0–40.0)

## 2022-09-29 LAB — HEPATIC FUNCTION PANEL
ALT: 18 U/L (ref 0–35)
AST: 22 U/L (ref 0–37)
Albumin: 4 g/dL (ref 3.5–5.2)
Alkaline Phosphatase: 66 U/L (ref 39–117)
Bilirubin, Direct: 0.1 mg/dL (ref 0.0–0.3)
Total Bilirubin: 0.5 mg/dL (ref 0.2–1.2)
Total Protein: 7 g/dL (ref 6.0–8.3)

## 2022-09-29 LAB — CBC WITH DIFFERENTIAL/PLATELET
Basophils Absolute: 0.1 10*3/uL (ref 0.0–0.1)
Basophils Relative: 1.2 % (ref 0.0–3.0)
Eosinophils Absolute: 0.1 10*3/uL (ref 0.0–0.7)
Eosinophils Relative: 2.1 % (ref 0.0–5.0)
HCT: 44.9 % (ref 36.0–46.0)
Hemoglobin: 15.1 g/dL — ABNORMAL HIGH (ref 12.0–15.0)
Lymphocytes Relative: 27.2 % (ref 12.0–46.0)
Lymphs Abs: 1.3 10*3/uL (ref 0.7–4.0)
MCHC: 33.7 g/dL (ref 30.0–36.0)
MCV: 98.8 fl (ref 78.0–100.0)
Monocytes Absolute: 0.4 10*3/uL (ref 0.1–1.0)
Monocytes Relative: 9 % (ref 3.0–12.0)
Neutro Abs: 2.9 10*3/uL (ref 1.4–7.7)
Neutrophils Relative %: 60.5 % (ref 43.0–77.0)
Platelets: 187 10*3/uL (ref 150.0–400.0)
RBC: 4.54 Mil/uL (ref 3.87–5.11)
RDW: 13.6 % (ref 11.5–15.5)
WBC: 4.8 10*3/uL (ref 4.0–10.5)

## 2022-09-29 LAB — TSH: TSH: 3.93 u[IU]/mL (ref 0.35–5.50)

## 2022-09-29 LAB — VITAMIN D 25 HYDROXY (VIT D DEFICIENCY, FRACTURES): VITD: 51.99 ng/mL (ref 30.00–100.00)

## 2022-09-29 NOTE — Assessment & Plan Note (Signed)
Check Vit D and replete prn. 

## 2022-09-29 NOTE — Progress Notes (Signed)
   Subjective:    Patient ID: Mariah Moss, female    DOB: 1961/04/15, 62 y.o.   MRN: [id number]  HPI CPE- Due for mammo (plans to schedule).  UTD on pap, Tdap, colonoscopy.  'i feel great'  Patient Care Team    Relationship Specialty Notifications Start End  Sheliah Hatch, MD PCP - General Family Medicine  10/14/11   Key, Verita Schneiders, NP Nurse Practitioner Gynecology  12/09/14     Health Maintenance  Topic Date Due   MAMMOGRAM  07/23/2022   Zoster Vaccines- Shingrix (1 of 2) 12/30/2022 (Originally 12/05/2009)   INFLUENZA VACCINE  12/23/2022   PAP SMEAR-Modifier  07/03/2023   DTaP/Tdap/Td (2 - Td or Tdap) 12/08/2024   COLONOSCOPY (Pts 45-73yrs Insurance coverage will need to be confirmed)  05/22/2031   HPV VACCINES  Aged Out   COVID-19 Vaccine  Discontinued   Hepatitis C Screening  Discontinued   HIV Screening  Discontinued      Review of Systems Patient reports no vision/ hearing changes, adenopathy,fever, weight change,  persistant/recurrent hoarseness , swallowing issues, chest pain, palpitations, edema, persistant/recurrent cough, hemoptysis, dyspnea (rest/exertional/paroxysmal nocturnal), gastrointestinal bleeding (melena, rectal bleeding), abdominal pain, significant heartburn, bowel changes, GU symptoms (dysuria, hematuria, incontinence), Gyn symptoms (abnormal  bleeding, pain),  syncope, focal weakness, memory loss, numbness & tingling, skin/hair/nail changes, abnormal bruising or bleeding, anxiety, or depression.     Objective:   Physical Exam General Appearance:    Alert, cooperative, no distress, appears stated age  Head:    Normocephalic, without obvious abnormality, atraumatic  Eyes:    PERRL, conjunctiva/corneas clear, EOM's intact both eyes  Ears:    Normal TM's and external ear canals, both ears  Nose:   Nares normal, septum midline, mucosa normal, no drainage    or sinus tenderness  Throat:   Lips, mucosa, and tongue normal; teeth and gums normal  Neck:    Supple, symmetrical, trachea midline, no adenopathy;    Thyroid: no enlargement/tenderness/nodules  Back:     Symmetric, no curvature, ROM normal, no CVA tenderness  Lungs:     Clear to auscultation bilaterally, respirations unlabored  Chest Wall:    No tenderness or deformity   Heart:    Regular rate and rhythm, S1 and S2 normal, no murmur, rub   or gallop  Breast Exam:    Deferred to GYN  Abdomen:     Soft, non-tender, bowel sounds active all four quadrants,    no masses, no organomegaly  Genitalia:    Deferred to GYN  Rectal:    Extremities:   Extremities normal, atraumatic, no cyanosis or edema  Pulses:   2+ and symmetric all extremities  Skin:   Skin color, texture, turgor normal, no rashes or lesions  Lymph nodes:   Cervical, supraclavicular, and axillary nodes normal  Neurologic:   CNII-XII intact, normal strength, sensation and reflexes    throughout          Assessment & Plan:

## 2022-09-29 NOTE — Assessment & Plan Note (Signed)
Pt's PE WNL.  UTD on pap, colonoscopy, Tdap.  Due for mammo- pt to schedule.  Check labs.  Anticipatory guidance provided.

## 2022-09-29 NOTE — Assessment & Plan Note (Signed)
Well controlled.  Currently asymptomatic.  Check labs due to diuretic use but no anticipated med changes.

## 2022-09-29 NOTE — Patient Instructions (Signed)
Follow up in 1 year or as needed We'll notify you of your lab results and make any changes if needed Keep up the good work on healthy diet and regular exercise- you look great! Schedule your mammogram and have them send me a copy of the report Call with any questions or concerns Stay Safe!  Stay Healthy! Have a great summer!!!

## 2022-09-30 ENCOUNTER — Telehealth: Payer: Self-pay

## 2022-09-30 NOTE — Telephone Encounter (Signed)
-----   Message from Sheliah Hatch, MD sent at 09/30/2022  7:45 AM EDT ----- Labs are stable and look great!  No changes at this time

## 2022-09-30 NOTE — Telephone Encounter (Signed)
Left results on pt vm

## 2022-10-06 ENCOUNTER — Other Ambulatory Visit: Payer: Self-pay | Admitting: Family Medicine

## 2022-10-06 DIAGNOSIS — E039 Hypothyroidism, unspecified: Secondary | ICD-10-CM

## 2022-10-29 ENCOUNTER — Other Ambulatory Visit: Payer: Self-pay | Admitting: Gynecology

## 2022-10-29 DIAGNOSIS — Z1231 Encounter for screening mammogram for malignant neoplasm of breast: Secondary | ICD-10-CM

## 2022-11-15 ENCOUNTER — Ambulatory Visit
Admission: RE | Admit: 2022-11-15 | Discharge: 2022-11-15 | Disposition: A | Discharge status: Home / Self Care | Payer: BC Managed Care – PPO | Source: Ambulatory Visit | Attending: Gynecology | Admitting: Gynecology

## 2022-11-15 DIAGNOSIS — Z1231 Encounter for screening mammogram for malignant neoplasm of breast: Secondary | ICD-10-CM | POA: Diagnosis not present

## 2022-11-18 ENCOUNTER — Other Ambulatory Visit: Payer: Self-pay | Admitting: Gynecology

## 2022-11-18 DIAGNOSIS — R928 Other abnormal and inconclusive findings on diagnostic imaging of breast: Secondary | ICD-10-CM

## 2022-11-23 ENCOUNTER — Other Ambulatory Visit: Payer: Self-pay | Admitting: Family Medicine

## 2022-11-23 DIAGNOSIS — F419 Anxiety disorder, unspecified: Secondary | ICD-10-CM

## 2022-11-29 ENCOUNTER — Ambulatory Visit
Admission: RE | Admit: 2022-11-29 | Discharge: 2022-11-29 | Disposition: A | Discharge status: Home / Self Care | Payer: BC Managed Care – PPO | Source: Ambulatory Visit | Attending: Gynecology | Admitting: Gynecology

## 2022-11-29 ENCOUNTER — Ambulatory Visit: Payer: BC Managed Care – PPO

## 2022-11-29 DIAGNOSIS — R928 Other abnormal and inconclusive findings on diagnostic imaging of breast: Secondary | ICD-10-CM | POA: Diagnosis not present

## 2022-12-09 ENCOUNTER — Other Ambulatory Visit: Payer: BC Managed Care – PPO

## 2022-12-09 DIAGNOSIS — M85852 Other specified disorders of bone density and structure, left thigh: Secondary | ICD-10-CM

## 2022-12-09 DIAGNOSIS — M85851 Other specified disorders of bone density and structure, right thigh: Secondary | ICD-10-CM

## 2022-12-09 DIAGNOSIS — E049 Nontoxic goiter, unspecified: Secondary | ICD-10-CM

## 2022-12-09 DIAGNOSIS — E213 Hyperparathyroidism, unspecified: Secondary | ICD-10-CM | POA: Diagnosis not present

## 2022-12-09 LAB — BASIC METABOLIC PANEL
BUN: 26 mg/dL — ABNORMAL HIGH (ref 6–23)
CO2: 26 mEq/L (ref 19–32)
Calcium: 11.5 mg/dL — ABNORMAL HIGH (ref 8.4–10.5)
Chloride: 106 mEq/L (ref 96–112)
Creatinine, Ser: 1.04 mg/dL (ref 0.40–1.20)
GFR: 57.4 mL/min — ABNORMAL LOW (ref >60.00)
Glucose, Bld: 84 mg/dL (ref 70–99)
Potassium: 4.4 mEq/L (ref 3.5–5.1)
Sodium: 141 mEq/L (ref 135–145)

## 2022-12-09 LAB — T4, FREE: Free T4: 0.81 ng/dL (ref 0.60–1.60)

## 2022-12-09 LAB — TSH: TSH: 3.32 u[IU]/mL (ref 0.35–5.50)

## 2022-12-09 LAB — VITAMIN D 25 HYDROXY (VIT D DEFICIENCY, FRACTURES): VITD: 45.71 ng/mL (ref 30.00–100.00)

## 2022-12-10 ENCOUNTER — Other Ambulatory Visit: Payer: BC Managed Care – PPO

## 2022-12-13 NOTE — Progress Notes (Unsigned)
Patient ID: Mariah Moss, female   DOB: [birth year], 62 y.o.   MRN: [id number]           Chief complaint: Endocrinology follow-up   Referring physician: Beverely Low  History of Present Illness:  HYPERCALCEMIA  She was seen in initial consultation for hypercalcemia in 11/2016   Review of records show that she has had a high calcium since 2013 and this has been variable Highest calcium has been 11.3 and now 11.1  Her HCTZ was reduced to half a tablet in 2018  She has been asymptomatic with no history of kidney stones or fractures No height loss Has osteopenia as discussed below   Lab Results  Component Value Date   CALCIUM 11.5 (H) 12/09/2022   CALCIUM 10.7 (H) 09/29/2022   CALCIUM 11.1 (H) 12/08/2021   CALCIUM 11.3 (H) 08/27/2021   CALCIUM 10.6 (H) 12/08/2020   CALCIUM 10.6 (H) 08/26/2020   CALCIUM 11.3 (H) 12/11/2019   CALCIUM 10.3 07/11/2019   CALCIUM 10.9 (H) 06/06/2019    Lab Results  Component Value Date   PTH 120 (H) 11/09/2016   CALCIUM 11.5 (H) 12/09/2022      OSTEOPENIA:   Menopause at occurred at about age 71  She was started on Evista for her hip osteopenia with baseline T score -1.8 She has been taking this since June 2019 Does not complain of any hot flashes She does exercise with both aerobic and some weights  Bone density is minimally lower at the femoral neck at -2.0  Results:  04/02/2021 Lumbar spine L1-L4(L3) Femoral neck (FN)  T-score  -0.8 RFN: -1.8 LFN: -2.0  Change in BMD from previous DXA test (%)  -2%   Although her bone density in 2020 is relatively stable it was relatively lower in the radius   Lumbar spine L1-L4 (L3) Femoral neck (FN) 33% distal radius Ultra distal radius  T-score -0.8 RFN: -1.8 LFN: -1.9 -1.8 -2.5  Change in BMD from previous DXA test (%)  0.0%  -0.6%  -4.4% n/a    Baseline bone density done on 12/21/16 results:    Lumbar spine L1-L4 (L3) Femoral neck (FN) 33% distal R radius  T-score -0.8 RFN: -1.8 LFN: -1.7  -1.5     VITAMIN D deficiency:  25 (OH) Vitamin D level was low before starting supplementation  She is taking 2000 units of vitamin D3 with the following results  Lab Results  Component Value Date   VD25OH 45.71 12/09/2022   VD25OH 51.99 09/29/2022   VD25OH 60.28 12/08/2021   VD25OH 57.67 08/27/2021    THYROID history: See review of systems   Allergies as of 12/14/2022   No Known Allergies      Medication List        Accurate as of December 13, 2022  9:22 PM. If you have any questions, ask your nurse or doctor.          citalopram 20 MG tablet Commonly known as: CELEXA TAKE 1 TABLET BY MOUTH EVERY DAY   hydrochlorothiazide 12.5 MG tablet Commonly known as: HYDRODIURIL TAKE 1 TABLET BY MOUTH EVERY DAY   raloxifene 60 MG tablet Commonly known as: EVISTA TAKE 1 TABLET BY MOUTH EVERY DAY   vitamin D3 50 MCG (2000 UT) Caps Take by mouth.        Allergies: No Known Allergies  Past Medical History:  Diagnosis Date   Anxiety    Heart murmur    Hypertension     Past Surgical History:  Procedure  Laterality Date   BUNIONECTOMY  05/24/1998   Bunnonectomy  Right 03/2022   COLONOSCOPY  2011   in [place], [place] -Normal exam    Family History  Problem Relation Age of Onset   Heart disease Mother    Stroke Mother    Hypertension Mother    Cancer Father    Diabetes Father    Thyroid disease Neg Hx    Colon cancer Neg Hx    Esophageal cancer Neg Hx    Stomach cancer Neg Hx     Social History:  reports that she has never smoked. She has never used smokeless tobacco. She reports current alcohol use of about 3.0 standard drinks of alcohol per week. She reports that she does not use drugs.  Review of Systems  Long-standing history of hypertension, treated with 25 mg for several years Controlled with 12.5 mg of hydrochlorothiazide  BP Readings from Last 3 Encounters:  09/29/22 122/80  12/11/21 128/90  08/27/21 122/78    Subclinical  hypothyroidism:   Previously had been found to have a slightly TSH by PCP  She also was felt to have a goiter on her initial exam in 7/18 but ultrasound showed only heterogenous thyroid enlargement and no nodules  In 12/18 at her annual exam she was recommended starting 50 g of levothyroxine by her PCP She has been off her levothyroxine supplement since 05/2017 since she did not feel any different after a trial of supplement  No complaints of fatigue TSH is consistently normal    Lab Results  Component Value Date   TSH 3.32 12/09/2022   TSH 3.93 09/29/2022   TSH 4.20 12/08/2021   FREET4 0.81 12/09/2022   FREET4 0.73 12/08/2021   FREET4 0.93 12/08/2020    Taking long-term Celexa for anxiety   EXAM:  LMP 12/14/2011    Right thyroid lobe is enlarged about 1-1/2-2 times normal, firm with slightly irregular texture  Left lobe is not clearly palpable   No abnormal prominence of the spinous projections of the spine at the thoracic and lumbar regions  Assessment/Plan:   HYPERCALCEMIA: She has had mild chronic hypercalcemia for several years She has primary hyperparathyroidism confirmed on her high PTH level previously Calcium is consistently under 11.5 with some fluctuation She still does take 12.5 mg HCTZ  Since she does not have any significant osteoporosis or kidney stones she will likely need observation only   OSTEOPENIA: She is being treated with Evista since 2019 Bone density was relatively stable last year and lowest T score - 2.0  Again discussed that Evista helps reduce fractures even though bone density may not increase  Vitamin D deficiency: Adequately replaced  THYROID enlargement possibly from Hashimoto's thyroiditis Thyroid enlargement appears to be less than last year and not clearly palpable on the left TSH normal consistently   Follow-up visit in 1 year   There are no Patient Instructions on file for this visit.   Reather Littler 12/13/2022, 9:22  PM

## 2022-12-14 ENCOUNTER — Ambulatory Visit (INDEPENDENT_AMBULATORY_CARE_PROVIDER_SITE_OTHER): Payer: BC Managed Care – PPO | Admitting: Endocrinology

## 2022-12-14 ENCOUNTER — Encounter: Payer: Self-pay | Admitting: Endocrinology

## 2022-12-14 VITALS — BP 138/80 | HR 55 | Ht 64.0 in | Wt 143.0 lb

## 2022-12-14 DIAGNOSIS — E049 Nontoxic goiter, unspecified: Secondary | ICD-10-CM | POA: Diagnosis not present

## 2022-12-14 DIAGNOSIS — M85852 Other specified disorders of bone density and structure, left thigh: Secondary | ICD-10-CM

## 2022-12-14 DIAGNOSIS — M85851 Other specified disorders of bone density and structure, right thigh: Secondary | ICD-10-CM | POA: Diagnosis not present

## 2022-12-14 DIAGNOSIS — E213 Hyperparathyroidism, unspecified: Secondary | ICD-10-CM | POA: Diagnosis not present

## 2022-12-29 ENCOUNTER — Other Ambulatory Visit: Payer: Self-pay | Admitting: Endocrinology

## 2023-01-14 ENCOUNTER — Other Ambulatory Visit (INDEPENDENT_AMBULATORY_CARE_PROVIDER_SITE_OTHER): Payer: BC Managed Care – PPO

## 2023-01-14 DIAGNOSIS — E213 Hyperparathyroidism, unspecified: Secondary | ICD-10-CM | POA: Diagnosis not present

## 2023-01-14 LAB — CALCIUM: Calcium: 10.8 mg/dL — ABNORMAL HIGH (ref 8.4–10.5)

## 2023-02-24 ENCOUNTER — Other Ambulatory Visit: Payer: Self-pay | Admitting: Family Medicine

## 2023-02-24 DIAGNOSIS — F419 Anxiety disorder, unspecified: Secondary | ICD-10-CM

## 2023-03-14 ENCOUNTER — Other Ambulatory Visit: Payer: Self-pay

## 2023-04-09 ENCOUNTER — Other Ambulatory Visit: Payer: Self-pay | Admitting: Family Medicine

## 2023-04-09 DIAGNOSIS — E039 Hypothyroidism, unspecified: Secondary | ICD-10-CM

## 2023-05-17 ENCOUNTER — Encounter: Payer: Self-pay | Admitting: Family Medicine

## 2023-05-31 DIAGNOSIS — J342 Deviated nasal septum: Secondary | ICD-10-CM | POA: Insufficient documentation

## 2023-05-31 DIAGNOSIS — R0989 Other specified symptoms and signs involving the circulatory and respiratory systems: Secondary | ICD-10-CM | POA: Insufficient documentation

## 2023-05-31 DIAGNOSIS — R059 Cough, unspecified: Secondary | ICD-10-CM | POA: Diagnosis not present

## 2023-06-01 ENCOUNTER — Other Ambulatory Visit: Payer: Self-pay | Admitting: Family Medicine

## 2023-06-01 DIAGNOSIS — F419 Anxiety disorder, unspecified: Secondary | ICD-10-CM

## 2023-07-19 DIAGNOSIS — Z01419 Encounter for gynecological examination (general) (routine) without abnormal findings: Secondary | ICD-10-CM | POA: Diagnosis not present

## 2023-07-19 DIAGNOSIS — Z130 Encounter for screening for diseases of the blood and blood-forming organs and certain disorders involving the immune mechanism: Secondary | ICD-10-CM | POA: Diagnosis not present

## 2023-07-22 DIAGNOSIS — M2022 Hallux rigidus, left foot: Secondary | ICD-10-CM | POA: Diagnosis not present

## 2023-08-01 DIAGNOSIS — Z1283 Encounter for screening for malignant neoplasm of skin: Secondary | ICD-10-CM | POA: Diagnosis not present

## 2023-08-01 DIAGNOSIS — D225 Melanocytic nevi of trunk: Secondary | ICD-10-CM | POA: Diagnosis not present

## 2023-08-04 DIAGNOSIS — M1811 Unilateral primary osteoarthritis of first carpometacarpal joint, right hand: Secondary | ICD-10-CM | POA: Diagnosis not present

## 2023-08-04 DIAGNOSIS — M65342 Trigger finger, left ring finger: Secondary | ICD-10-CM | POA: Diagnosis not present

## 2023-08-04 DIAGNOSIS — M65341 Trigger finger, right ring finger: Secondary | ICD-10-CM | POA: Diagnosis not present

## 2023-08-27 ENCOUNTER — Other Ambulatory Visit: Payer: Self-pay | Admitting: Family Medicine

## 2023-08-27 DIAGNOSIS — F419 Anxiety disorder, unspecified: Secondary | ICD-10-CM

## 2023-09-30 ENCOUNTER — Encounter: Payer: Self-pay | Admitting: Family Medicine

## 2023-09-30 ENCOUNTER — Ambulatory Visit (INDEPENDENT_AMBULATORY_CARE_PROVIDER_SITE_OTHER): Payer: BC Managed Care – PPO | Admitting: Family Medicine

## 2023-09-30 VITALS — BP 140/90 | HR 61 | Temp 97.8°F | Ht 64.0 in | Wt 144.2 lb

## 2023-09-30 DIAGNOSIS — I10 Essential (primary) hypertension: Secondary | ICD-10-CM | POA: Diagnosis not present

## 2023-09-30 DIAGNOSIS — Z1159 Encounter for screening for other viral diseases: Secondary | ICD-10-CM | POA: Diagnosis not present

## 2023-09-30 DIAGNOSIS — Z114 Encounter for screening for human immunodeficiency virus [HIV]: Secondary | ICD-10-CM

## 2023-09-30 DIAGNOSIS — Z0000 Encounter for general adult medical examination without abnormal findings: Secondary | ICD-10-CM | POA: Diagnosis not present

## 2023-09-30 DIAGNOSIS — E213 Hyperparathyroidism, unspecified: Secondary | ICD-10-CM | POA: Diagnosis not present

## 2023-09-30 LAB — CBC WITH DIFFERENTIAL/PLATELET
Basophils Absolute: 0.1 10*3/uL (ref 0.0–0.1)
Basophils Relative: 2.1 % (ref 0.0–3.0)
Eosinophils Absolute: 0.1 10*3/uL (ref 0.0–0.7)
Eosinophils Relative: 3 % (ref 0.0–5.0)
HCT: 46.3 % — ABNORMAL HIGH (ref 36.0–46.0)
Hemoglobin: 15.6 g/dL — ABNORMAL HIGH (ref 12.0–15.0)
Lymphocytes Relative: 20.5 % (ref 12.0–46.0)
Lymphs Abs: 1 10*3/uL (ref 0.7–4.0)
MCHC: 33.7 g/dL (ref 30.0–36.0)
MCV: 99.7 fl (ref 78.0–100.0)
Monocytes Absolute: 0.4 10*3/uL (ref 0.1–1.0)
Monocytes Relative: 8 % (ref 3.0–12.0)
Neutro Abs: 3.1 10*3/uL (ref 1.4–7.7)
Neutrophils Relative %: 66.4 % (ref 43.0–77.0)
Platelets: 207 10*3/uL (ref 150.0–400.0)
RBC: 4.65 Mil/uL (ref 3.87–5.11)
RDW: 14.2 % (ref 11.5–15.5)
WBC: 4.7 10*3/uL (ref 4.0–10.5)

## 2023-09-30 LAB — HEPATIC FUNCTION PANEL
ALT: 18 U/L (ref 0–35)
AST: 20 U/L (ref 0–37)
Albumin: 4.1 g/dL (ref 3.5–5.2)
Alkaline Phosphatase: 68 U/L (ref 39–117)
Bilirubin, Direct: 0.1 mg/dL (ref 0.0–0.3)
Total Bilirubin: 0.4 mg/dL (ref 0.2–1.2)
Total Protein: 6.9 g/dL (ref 6.0–8.3)

## 2023-09-30 LAB — LIPID PANEL
Cholesterol: 207 mg/dL — ABNORMAL HIGH (ref 0–200)
HDL: 76.1 mg/dL (ref >39.00)
LDL Cholesterol: 116 mg/dL — ABNORMAL HIGH (ref 0–99)
NonHDL: 130.46
Total CHOL/HDL Ratio: 3
Triglycerides: 73 mg/dL (ref 0.0–149.0)
VLDL: 14.6 mg/dL (ref 0.0–40.0)

## 2023-09-30 LAB — BASIC METABOLIC PANEL WITH GFR
BUN: 27 mg/dL — ABNORMAL HIGH (ref 6–23)
CO2: 30 meq/L (ref 19–32)
Calcium: 11.1 mg/dL — ABNORMAL HIGH (ref 8.4–10.5)
Chloride: 104 meq/L (ref 96–112)
Creatinine, Ser: 1.05 mg/dL (ref 0.40–1.20)
GFR: 56.43 mL/min — ABNORMAL LOW (ref >60.00)
Glucose, Bld: 60 mg/dL — ABNORMAL LOW (ref 70–99)
Potassium: 4.6 meq/L (ref 3.5–5.1)
Sodium: 140 meq/L (ref 135–145)

## 2023-09-30 LAB — TSH: TSH: 3.53 u[IU]/mL (ref 0.35–5.50)

## 2023-09-30 LAB — VITAMIN D 25 HYDROXY (VIT D DEFICIENCY, FRACTURES): VITD: 40.35 ng/mL (ref 30.00–100.00)

## 2023-09-30 NOTE — Patient Instructions (Addendum)
 Follow up in 6 months to recheck blood pressure We'll notify you of your lab results and make any changes if needed Keep up the good work on healthy diet and regular exercise- you look great! Call with any questions or concerns Stay Safe!  Stay Healthy! HAPPY RETIREMENT!!!

## 2023-09-30 NOTE — Assessment & Plan Note (Signed)
 Pt's PE WNL w/ exception of mildly elevated BP.  UTD on pap, mammo, colonoscopy, Tdap.  Check labs.  Anticipatory guidance provided.

## 2023-09-30 NOTE — Progress Notes (Signed)
   Subjective:    Patient ID: Mariah Moss, female    DOB: [birth year], 63 y.o.   MRN: [id number]  HPI CPE- UTD on mammo, pap, colonoscopy, Tdap  Patient Care Team    Relationship Specialty Notifications Start End  Jess Morita, MD PCP - General Family Medicine  10/14/11   Key, Landa Pine, NP Nurse Practitioner Gynecology  12/09/14      Health Maintenance  Topic Date Due   HIV Screening  Never done   Hepatitis C Screening  Never done   COVID-19 Vaccine (4 - 2024-25 season) 01/23/2023   MAMMOGRAM  11/15/2023   INFLUENZA VACCINE  12/23/2023   DTaP/Tdap/Td (2 - Td or Tdap) 12/08/2024   Cervical Cancer Screening (HPV/Pap Cotest)  07/02/2025   Colonoscopy  05/22/2031   Zoster Vaccines- Shingrix  Completed   HPV VACCINES  Aged Out   Meningococcal B Vaccine  Aged Out      Review of Systems Patient reports no vision/ hearing changes, adenopathy,fever, weight change,  persistant/recurrent hoarseness , swallowing issues, chest pain, palpitations, edema, persistant/recurrent cough, hemoptysis, dyspnea (rest/exertional/paroxysmal nocturnal), gastrointestinal bleeding (melena, rectal bleeding), abdominal pain, significant heartburn, bowel changes, GU symptoms (dysuria, hematuria, incontinence), Gyn symptoms (abnormal  bleeding, pain),  syncope, focal weakness, memory loss, numbness & tingling, skin/hair/nail changes, abnormal bruising or bleeding, anxiety, or depression.     Objective:   Physical Exam General Appearance:    Alert, cooperative, no distress, appears stated age  Head:    Normocephalic, without obvious abnormality, atraumatic  Eyes:    PERRL, conjunctiva/corneas clear, EOM's intact both eyes  Ears:    Normal TM's and external ear canals, both ears  Nose:   Nares normal, septum midline, mucosa normal, no drainage    or sinus tenderness  Throat:   Lips, mucosa, and tongue normal; teeth and gums normal  Neck:   Supple, symmetrical, trachea midline, no adenopathy;    Thyroid :  no enlargement/tenderness/nodules  Back:     Symmetric, no curvature, ROM normal, no CVA tenderness  Lungs:     Clear to auscultation bilaterally, respirations unlabored  Chest Wall:    No tenderness or deformity   Heart:    Regular rate and rhythm, S1 and S2 normal, no murmur, rub   or gallop  Breast Exam:    Deferred to GYN  Abdomen:     Soft, non-tender, bowel sounds active all four quadrants,    no masses, no organomegaly  Genitalia:    Deferred to GYN  Rectal:    Extremities:   Extremities normal, atraumatic, no cyanosis or edema  Pulses:   2+ and symmetric all extremities  Skin:   Skin color, texture, turgor normal, no rashes or lesions  Lymph nodes:   Cervical, supraclavicular, and axillary nodes normal  Neurologic:   CNII-XII intact, normal strength, sensation and reflexes    throughout          Assessment & Plan:

## 2023-10-01 LAB — HIV ANTIBODY (ROUTINE TESTING W REFLEX): HIV 1&2 Ab, 4th Generation: NONREACTIVE

## 2023-10-01 LAB — HEPATITIS C ANTIBODY: Hepatitis C Ab: NONREACTIVE

## 2023-10-03 ENCOUNTER — Encounter: Payer: Self-pay | Admitting: Family Medicine

## 2023-10-03 ENCOUNTER — Telehealth: Payer: Self-pay

## 2023-10-03 NOTE — Telephone Encounter (Signed)
 Pt has reviewed labs via MyChart

## 2023-10-03 NOTE — Telephone Encounter (Signed)
-----   Message from Laymon Priest sent at 10/03/2023  7:32 AM EDT ----- Calcium is mildly elevated but stable.  GFR (kidney filtration rate) is also stable.  Sugar is mildly low but you were fasting and this is not a cause for concern.  Remainder of labs look great!

## 2023-10-19 ENCOUNTER — Ambulatory Visit (INDEPENDENT_AMBULATORY_CARE_PROVIDER_SITE_OTHER): Admitting: Endocrinology

## 2023-10-19 ENCOUNTER — Other Ambulatory Visit

## 2023-10-19 ENCOUNTER — Other Ambulatory Visit: Payer: Self-pay | Admitting: Gynecology

## 2023-10-19 ENCOUNTER — Encounter: Payer: Self-pay | Admitting: Endocrinology

## 2023-10-19 VITALS — BP 126/70 | HR 67 | Resp 20 | Ht 64.0 in | Wt 146.4 lb

## 2023-10-19 DIAGNOSIS — E210 Primary hyperparathyroidism: Secondary | ICD-10-CM

## 2023-10-19 DIAGNOSIS — M85852 Other specified disorders of bone density and structure, left thigh: Secondary | ICD-10-CM

## 2023-10-19 DIAGNOSIS — M85851 Other specified disorders of bone density and structure, right thigh: Secondary | ICD-10-CM

## 2023-10-19 DIAGNOSIS — Z1231 Encounter for screening mammogram for malignant neoplasm of breast: Secondary | ICD-10-CM

## 2023-10-19 DIAGNOSIS — E559 Vitamin D deficiency, unspecified: Secondary | ICD-10-CM | POA: Diagnosis not present

## 2023-10-19 MED ORDER — RALOXIFENE HCL 60 MG PO TABS: 60.0000 mg | ORAL_TABLET | Freq: Every day | ORAL | 3 refills | Status: DC

## 2023-10-19 NOTE — Addendum Note (Signed)
 Addended by: Lasheba Stevens, [place] on: 10/19/2023 04:45 PM   Modules accepted: Level of Service

## 2023-10-19 NOTE — Patient Instructions (Signed)
 Will plan for repeat lab for calcium and parathyroid .  Plan for 24-hour urine calcium test.   24-hr Urine Collection: - Preferably to be done while staying at home and start in the morning.  - Discard the first urine of the morning after you wake up because that was the urine from overnight. DO NOT COLLECT FIRST URINE. Then begin 24-hour urine collection.  Collect your urine every time you pee for next 24 hours which means all day and overnight until next morning.  You collect the last urine next morning, that COMPLETES the collection.   DXA scan in 6 months.  Decrease vitamin D  from 2000 to 1000 international unit daily.

## 2023-10-19 NOTE — Progress Notes (Signed)
 Outpatient Endocrinology Note Mariah Yaacov Koziol, MD   Patient's Name: Mariah Moss    DOB: March 06, 1961    MRN: [id number]  REASON OF VISIT: Follow up for primary hyperparathyroidism / Osteopenia  PCP: Mariah Morita, MD  HISTORY OF PRESENT ILLNESS:   Mariah Moss is a 63 y.o. old female with past medical history listed below, is here for follow up of primary hyperparathyroidism and bone health issues /osteopenia.  Pertinent Bone Health History: Patient was previously seen by Dr. Hubert Moss and was last time seen in July 2024. Patient is known to have primary hyperparathyroidism, being monitored conservatively.   Patient was evaluated in the initial consult in this clinic by Dr. Hubert Moss in July 2018.  Patient had serum calcium in the range of 10.6 to 11.5% and had elevated parathyroid  hormone of 121 in 2018.  She was taking hydrochlorothiazide  and hydrochlorothiazide  as was decreased from 25 to 12.5 mg daily.  She has been asymptomatic with no kidney stone and fracture.  No family history of kidney stone.  Ultrasound thyroid  in July 2018 reviewed images diffusely heterogenous thyroid  parenchyma, no large definitive solid nodule, possible right upper pole 0.4 cm hyperechoic nodule.  She had taken trial of levothyroxine  50 mcg daily in the past, did not have any symptoms difference, has been off of the levothyroxine  since 2019.  She has normal thyroid  function test off of levothyroxine  since then.  # Osteopenia :  Menopause at occurred at about age 47   She was started on Evista  for her hip osteopenia with baseline T score -1.8 She has been taking this since June 2019 Does not complain of any hot flashes She is exercising regularly   Bone density in 11/22 was minimally lower at the femoral neck at -2.0   Results:  04/02/2021 Lumbar spine L1-L4(L3) Femoral neck (FN)  T-score  -0.8 RFN: -1.8 LFN: -2.0  Change in BMD from previous DXA test (%)   -2%    Previous bone densities:     Lumbar  spine L1-L4 (L3) Femoral neck (FN) 33% distal radius Ultra distal radius  T-score -0.8 RFN: -1.8 LFN: -1.9 -1.8 -2.5  Change in BMD from previous DXA test (%)  0.0%  -0.6%  -4.4% n/a      Baseline bone density done on 12/21/16 results:     Lumbar spine L1-L4 (L3) Femoral neck (FN) 33% distal R radius  T-score -0.8 RFN: -1.8 LFN: -1.7 -1.5      VITAMIN D  deficiency:   25 (OH) Vitamin D  level was low before starting supplementation   She is taking 2000 units of vitamin D3 with the following results   Interval history  Patient presented for the follow-up, she was last time seen by Dr. Hubert Moss in July 2024.  Patient had recent lab on May 9, serum calcium 11.1 relatively stable.  She had normal TSH of 3.53.  She had vitamin D  level of 40.  She has been currently taking vitamin D3 2000 international unit daily.  She has no fall and fracture.  No bony pain.  She has complaints of frequent urination.  No history of kidney stone.  She also has been taking hydrochlorothiazide  with well-controlled blood pressure.  With review of chart she also has chronic kidney disease IIIA with EGFR in upper 50s.  She eats yogurt almost daily and cheese couple of times a week.  She does not take additional calcium supplement.  She has been taking Evista  for the osteopenia.  REVIEW OF SYSTEMS:  As per history of present illness.   PAST MEDICAL HISTORY: Past Medical History:  Diagnosis Date   Anxiety    Heart murmur    Hypertension     PAST SURGICAL HISTORY: Past Surgical History:  Procedure Laterality Date   BUNIONECTOMY  05/24/1998   Bunnonectomy  Right 03/2022   COLONOSCOPY  2011   in [place], [place] -Normal exam    ALLERGIES: No Known Allergies  FAMILY HISTORY:  Family History  Problem Relation Age of Onset   Heart disease Mother    Stroke Mother    Hypertension Mother    Cancer Father    Diabetes Father    Thyroid  disease Neg Hx    Colon cancer Neg Hx    Esophageal cancer Neg Hx     Stomach cancer Neg Hx     SOCIAL HISTORY: Social History   Socioeconomic History   Marital status: Married    Spouse name: Not on file   Number of children: Not on file   Years of education: Not on file   Highest education level: Not on file  Occupational History   Not on file  Tobacco Use   Smoking status: Never   Smokeless tobacco: Never  Vaping Use   Vaping status: Never Used  Substance and Sexual Activity   Alcohol use: Yes    Alcohol/week: 3.0 standard drinks of alcohol    Types: 3 Standard drinks or equivalent per week    Comment: occasionally   Drug use: No   Sexual activity: Not Currently  Other Topics Concern   Not on file  Social History Narrative   Not on file   Social Drivers of Health   Financial Resource Strain: Not on file  Food Insecurity: Not on file  Transportation Needs: Not on file  Physical Activity: Not on file  Stress: Not on file  Social Connections: Not on file    MEDICATIONS:  Current Outpatient Medications  Medication Sig Dispense Refill   citalopram  (CELEXA ) 20 MG tablet TAKE 1 TABLET BY MOUTH EVERY DAY 90 tablet 0   hydrochlorothiazide  (HYDRODIURIL ) 12.5 MG tablet TAKE 1 TABLET BY MOUTH EVERY DAY 90 tablet 1   Vitamin D , Cholecalciferol, 50 MCG (2000 UT) CAPS Take by mouth.     raloxifene  (EVISTA ) 60 MG tablet Take 1 tablet (60 mg total) by mouth daily. 90 tablet 3   No current facility-administered medications for this visit.    PHYSICAL EXAM: Vitals:   10/19/23 1412  BP: 126/70  Pulse: 67  Resp: 20  SpO2: 95%  Weight: 146 lb 6.4 oz (66.4 kg)  Height: 5\' 4"  (1.626 m)   Body mass index is 25.13 kg/m.  Wt Readings from Last 3 Encounters:  10/19/23 146 lb 6.4 oz (66.4 kg)  09/30/23 144 lb 4 oz (65.4 kg)  12/14/22 143 lb (64.9 kg)    General: Well developed, well nourished female in no apparent distress.  HEENT: AT/[place], no external lesions. Hearing intact to the spoken word Eyes: EOMI. Conjunctiva clear and no  icterus. Neck: Trachea midline, neck supple without appreciable thyromegaly or lymphadenopathy and no palpable thyroid  nodules Lungs: Clear to auscultation, no wheeze. Respirations not labored Heart: S1S2, Regular in rate and rhythm.  Abdomen: Soft, non tender, non distended Neurologic: Alert, oriented, normal speech, deep tendon biceps reflexes normal,  no gross focal neurological deficit Extremities: No pedal pitting edema, no tremors of outstretched hands. No spine tenderness Skin: Warm, color good.  Psychiatric: Does not appear depressed or anxious  PERTINENT HISTORIC LABORATORY AND IMAGING STUDIES:  All pertinent laboratory results were reviewed. Please see HPI also for further details.   Lab Results  Component Value Date   ALKPHOS 68 09/30/2023   ALKPHOS 66 09/29/2022   ALKPHOS 61 08/27/2021     ASSESSMENT / PLAN  1. Primary hyperparathyroidism (HCC)   2. Osteopenia of both hips   3. Vitamin D  deficiency    # Primary hyperparathyroidism : - Patient has calcium Mia with blood sugar in the range of 10.6-11.5, at least from 2013, had elevated parathyroid  hormone of 120, consistent with primary hyperparathyroidism.  This is being conservatively absorbed.  No kidney stone.  No fracture.  She has osteopenia.  -With the lab reviewed noted to have patient has CKD 3A with EGFR mostly in the upper 50s range, has remained stable over few years.  No obvious cause to cause CKD, she has controlled blood pressure on 1 antihypertensive medication.  - Discussed that definitive treatment of primary hyperparathyroidism is parathyroidectomy/surgical treatment.  We need to consider doing parathyroid  localized on his studies with ultrasound neck and parathyroid  sestamibi nuclear scan.  -She has osteopenia, also has CKD in this context I recommended for definitive treatment for primary hyperparathyroidism.  Patient prefers to follow conservatively and wants to avoid surgical treatment as much as  possible.    -Patient has been on hydrochlorothiazide  12.5 mg daily for hypertension, can contribute some hypercalcemia however she has significant hyperglycemia with elevated PTH.  # Osteopenia : - She has been on Evista  60 mg daily since 2019 for osteopenia. - Last DEXA scan was in November 2022 with lowest T-score of -2.0. - She has been on vitamin D  supplement.  She is on dietary calcium supplement.  Plan: - Check renal function panel and PTH today.  PTH was checked once in 2018 was elevated 120. - Will check 24-hour urine calcium test. - For now we will stay on observing calcium levels, if getting worse or if there is any other indication will discuss with patient for further evaluation for primary hyperparathyroidism with localized imaging studies and consider for parathyroidectomy.  At this time patient prefers to follow conservatively. - Check DEXA scan prior to follow-up visit. - Continue Evista  60 mg daily for now. - Decrease vitamin D  from 2000 to 1000 international unit daily.  She had recent vitamin D  below 40.  In the context of hypercalcemia prefers to have lower normal range of vitamin D  which is around 30. - Discussed about weightbearing exercise and fall precautions.  Diagnoses and all orders for this visit:  Primary hyperparathyroidism (HCC) -     Parathyroid  hormone, intact (no Ca) -     Renal function panel -     Calcium, 24-Hour Urine with Creatinine -     DG Bone Density; Future  Osteopenia of both hips -     DG Bone Density; Future -     raloxifene  (EVISTA ) 60 MG tablet; Take 1 tablet (60 mg total) by mouth daily.  Vitamin D  deficiency    I spent 35 minutes in this visit, with face to face encounter, medical records review, imagings review, documentation and discussion with patient about of findings, diagnosis, medications, management plan as above. All questions were answered.   DISPOSITION Follow up in clinic in 6 months suggested.  All questions  answered and patient verbalized understanding of the plan.  Mariah Nayquan Evinger, MD [facility] Endocrinology [facility] [address] [place], [address] [place], [place] [address] Phone #  [phone]  At least part of this note was generated using voice recognition software. Inadvertent word errors may have occurred, which were not recognized during the proofreading process.

## 2023-10-21 ENCOUNTER — Ambulatory Visit: Payer: Self-pay | Admitting: Endocrinology

## 2023-10-24 ENCOUNTER — Telehealth: Payer: Self-pay

## 2023-10-24 ENCOUNTER — Encounter: Payer: Self-pay | Admitting: Endocrinology

## 2023-10-24 DIAGNOSIS — E210 Primary hyperparathyroidism: Secondary | ICD-10-CM

## 2023-10-24 NOTE — Telephone Encounter (Signed)
 Dexascan scheduled for 04/24/24 at 10 am. MD made aware.

## 2023-10-28 NOTE — Telephone Encounter (Signed)
 I have placed order for parathyroid  sestamibi nuclear scan and ultrasound neck for thyroid  and parathyroid .

## 2023-10-30 ENCOUNTER — Other Ambulatory Visit: Payer: Self-pay | Admitting: Family Medicine

## 2023-10-30 DIAGNOSIS — E039 Hypothyroidism, unspecified: Secondary | ICD-10-CM

## 2023-10-31 ENCOUNTER — Telehealth: Payer: Self-pay

## 2023-10-31 NOTE — Telephone Encounter (Signed)
 Patient returned called to make office aware that she is recently retired so insurance has ended, will be using [employer] going forward however it has not yet started

## 2023-10-31 NOTE — Telephone Encounter (Signed)
 Patient attempted to determine current insurance information as directed by MD. VM left requesting call back

## 2023-11-01 ENCOUNTER — Other Ambulatory Visit: Payer: Self-pay

## 2023-11-01 DIAGNOSIS — E210 Primary hyperparathyroidism: Secondary | ICD-10-CM | POA: Diagnosis not present

## 2023-11-02 LAB — CALCIUM, 24-HOUR URINE WITH CREATININE
CALCIUM/CREATININE RATIO: 134 mg/g{creat} (ref 30–275)
Calcium, 24H Urine: 142 mg/(24.h)
Creatinine, 24H Ur: 1.08 g/(24.h) (ref 0.50–2.15)

## 2023-11-16 ENCOUNTER — Other Ambulatory Visit

## 2023-11-30 ENCOUNTER — Ambulatory Visit
Admission: RE | Admit: 2023-11-30 | Discharge: 2023-11-30 | Disposition: A | Discharge status: Home / Self Care | Source: Ambulatory Visit | Attending: Gynecology | Admitting: Gynecology

## 2023-11-30 DIAGNOSIS — Z1231 Encounter for screening mammogram for malignant neoplasm of breast: Secondary | ICD-10-CM | POA: Diagnosis not present

## 2023-12-02 ENCOUNTER — Other Ambulatory Visit: Payer: Self-pay | Admitting: Family Medicine

## 2023-12-02 DIAGNOSIS — F419 Anxiety disorder, unspecified: Secondary | ICD-10-CM

## 2023-12-14 ENCOUNTER — Ambulatory Visit
Admission: RE | Admit: 2023-12-14 | Discharge: 2023-12-14 | Disposition: A | Discharge status: Home / Self Care | Source: Ambulatory Visit | Attending: Endocrinology | Admitting: Endocrinology

## 2023-12-14 DIAGNOSIS — E210 Primary hyperparathyroidism: Secondary | ICD-10-CM | POA: Diagnosis not present

## 2023-12-22 ENCOUNTER — Ambulatory Visit: Payer: Self-pay | Admitting: Endocrinology

## 2023-12-22 DIAGNOSIS — E210 Primary hyperparathyroidism: Secondary | ICD-10-CM

## 2023-12-22 NOTE — Telephone Encounter (Signed)
 No treatment is required for ultrasound thyroid  findings.  In regard to her calcium problems she still need to complete nuclear parathyroid  scan and DEXA scan/bone density test.

## 2023-12-23 NOTE — Telephone Encounter (Signed)
 I placed a new order for nuclear parathyroid  scan.

## 2024-01-06 ENCOUNTER — Ambulatory Visit (HOSPITAL_COMMUNITY)

## 2024-01-06 ENCOUNTER — Encounter (HOSPITAL_COMMUNITY)

## 2024-01-10 ENCOUNTER — Ambulatory Visit (HOSPITAL_COMMUNITY)
Admission: RE | Admit: 2024-01-10 | Discharge: 2024-01-10 | Disposition: A | Discharge status: Home / Self Care | Source: Ambulatory Visit | Attending: Endocrinology | Admitting: Endocrinology

## 2024-01-10 ENCOUNTER — Encounter (HOSPITAL_COMMUNITY)
Admission: RE | Admit: 2024-01-10 | Discharge: 2024-01-10 | Disposition: A | Discharge status: Home / Self Care | Source: Ambulatory Visit | Attending: Endocrinology | Admitting: Endocrinology

## 2024-01-10 DIAGNOSIS — E213 Hyperparathyroidism, unspecified: Secondary | ICD-10-CM | POA: Diagnosis not present

## 2024-01-10 DIAGNOSIS — E210 Primary hyperparathyroidism: Secondary | ICD-10-CM

## 2024-01-10 MED ORDER — TECHNETIUM TC 99M SESTAMIBI - CARDIOLITE
22.6000 | Freq: Once | INTRAVENOUS | Status: AC
  Administered 2024-01-10: 22.6 via INTRAVENOUS

## 2024-01-16 ENCOUNTER — Encounter: Payer: Self-pay | Admitting: Endocrinology

## 2024-01-17 ENCOUNTER — Ambulatory Visit: Payer: Self-pay | Admitting: Endocrinology

## 2024-01-17 DIAGNOSIS — E210 Primary hyperparathyroidism: Secondary | ICD-10-CM

## 2024-03-15 ENCOUNTER — Encounter: Payer: Self-pay | Admitting: Family Medicine

## 2024-03-19 ENCOUNTER — Ambulatory Visit: Payer: Self-pay

## 2024-03-19 NOTE — Telephone Encounter (Signed)
 Copied from CRM [id number]. Topic: Clinical - Red Word Triage >> Mar 19, 2024 12:43 PM China J wrote: [facility] that prompted transfer to Nurse Triage: Patient has been struggling with worsening wheezing and needs to schedule an appointment.  Call dropped prior to transfer. This RN placed a call to patient. No answer but was able to leave a voicemail. Will place in call backs.

## 2024-03-19 NOTE — Telephone Encounter (Signed)
 Patient has appt with Dr. Jerrell tomorrow for wheezing. FYI

## 2024-03-19 NOTE — Telephone Encounter (Signed)
 FYI Only or Action Required?: FYI only for provider.  Patient was last seen in primary care on 09/30/2023 by Mahlon Comer BRAVO, MD.  Called Nurse Triage reporting Wheezing. - long time mild difficulty breathing  - 1 year  Symptoms began 1 year ago.  Interventions attempted: Nothing.  Symptoms are: gradually worsening.  Triage Disposition: See PCP Within 2 Weeks  Patient/caregiver understands and will follow disposition?: Yes - appt for tomorrow afternoon                 Reason for Disposition  [1] MILD longstanding difficulty breathing (e.g., minimal/no SOB at rest, SOB with walking, pulse < 100) AND [2] SAME as normal  Answer Assessment - Initial Assessment Questions 1. RESPIRATORY STATUS: Describe your breathing? (e.g., wheezing, shortness of breath, unable to speak, severe coughing)      Wheezing - sob - unable to get a full lung-full of breath 2. ONSET: When did this breathing problem begin?      1 year - getting worse. Friends noticed months ago while exercise 3. PATTERN Does the difficult breathing come and go, or has it been constant since it started?      constat 4. SEVERITY: How bad is your breathing? (e.g., mild, moderate, severe)      mild 5. RECURRENT SYMPTOM: Have you had difficulty breathing before? If Yes, ask: When was the last time? and What happened that time?      1 year 6. CARDIAC HISTORY: Do you have any history of heart disease? (e.g., heart attack, angina, bypass surgery, angioplasty)      no 7. LUNG HISTORY: Do you have any history of lung disease?  (e.g., pulmonary embolus, asthma, emphysema)     no 8. CAUSE: What do you think is causing the breathing problem?      unsure 9. OTHER SYMPTOMS: Do you have any other symptoms? (e.g., chest pain, cough, dizziness, fever, runny nose)     Constant clearing of her throat.  Protocols used: Breathing Difficulty-A-AH

## 2024-03-20 ENCOUNTER — Ambulatory Visit (INDEPENDENT_AMBULATORY_CARE_PROVIDER_SITE_OTHER): Admitting: Student in an Organized Health Care Education/Training Program

## 2024-03-20 ENCOUNTER — Encounter: Payer: Self-pay | Admitting: Student in an Organized Health Care Education/Training Program

## 2024-03-20 VITALS — BP 169/112 | HR 64 | Wt 147.0 lb

## 2024-03-20 DIAGNOSIS — R0609 Other forms of dyspnea: Secondary | ICD-10-CM

## 2024-03-20 DIAGNOSIS — R0600 Dyspnea, unspecified: Secondary | ICD-10-CM | POA: Insufficient documentation

## 2024-03-20 DIAGNOSIS — R0989 Other specified symptoms and signs involving the circulatory and respiratory systems: Secondary | ICD-10-CM | POA: Diagnosis not present

## 2024-03-20 NOTE — Assessment & Plan Note (Signed)
 Chronic postnasal drip likely contributes to throat clearing and vocal cord irritation. ENT evaluation showed drainage, and examination reveals throat redness, suggesting ongoing irritation. Continue Flonase nasal spray, saline nasal irrigation, and Claritin daily. Advised on sinus hygiene.

## 2024-03-20 NOTE — Assessment & Plan Note (Signed)
 She has experienced intermittent wheezing and exertional dyspnea for one year, worsening over the past six months. The differential diagnosis includes vocal cord dysfunction and postnasal drip-related irritation.  I think this is low risk for reactive airway disease based on exam.  No significant tobacco exposure to put her at risk for chronic obstructive pulmonary disease.  Will order a chest x-ray to assess lung structure and pulmonary function testing to evaluate lung function and bronchodilator response. Advised her to return if symptoms worsen.  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

## 2024-03-20 NOTE — Progress Notes (Signed)
 Acute Office Visit  Patient ID: Mariah Moss, female    DOB: [birth year], 63 y.o.   MRN: [id number]  PCP: Mahlon Comer BRAVO, MD  Chief Complaint  Patient presents with   Shortness of Breath    Patient is short of breath when walking/exercising. Has been going on for a while but friends have noticed it more when exercising.  Patient very wheezy when inhaling, clears throat a lot and coughs a lot.       Subjective:     HPI  Discussed the use of AI scribe software for clinical note transcription with the patient, who gave verbal consent to proceed.  History of Present Illness Mariah Moss is a 63 year old female who presents with wheezing and difficulty breathing. She is accompanied by her spouse.  She has been experiencing wheezing and difficulty breathing, particularly during physical activities such as walking and playing pickleball. Her spouse and friends have also noticed her breathing sounds, describing it as a wheezing sound. These symptoms have been present for the past year, with worsening over the last six months. She does not always notice the wheezing herself but acknowledges feeling short of breath during exertion.  She denies any history of asthma but reports having sinus issues and allergies, with significant drainage. She reports that an ENT evaluation earlier this year, including a nasal scope, did not reveal any concerning findings, but she was told she has drainage. She has been using Claritin and a nasal spray, possibly Flonase, more consistently in the past week due to worsening symptoms.  She experiences throat clearing, especially after eating, but denies food going down the wrong pipe. No recent fevers or chills and does not feel sick currently.  Her past medical history includes hyperparathyroidism, for which she is taking Evista . She had foot surgery in 2023. She is recently retired from a non-strenuous job as a multimedia programmer.  She has a history of  secondhand smoke exposure from her parents, who smoked for about 20 years. She denies any tobacco or vape use herself.      Objective:    BP (!) 169/112   Pulse 64   Wt 147 lb (66.7 kg)   LMP 12/14/2011   SpO2 98%   BMI 25.23 kg/m   Physical Exam  Gen: Well-appearing woman Ears: Bilateral tympanic membranes look normal Mouth: Mild erythema and cobblestoning of the posterior oropharynx Neck: Normal thyroid , no nodules or adenopathy Heart: Regular, no murmur Lungs: Unlabored, clear throughout, no wheezing either peripherally or centrally, no crackles     Assessment & Plan:   Problem List Items Addressed This Visit       Unprioritized   Throat clearing   Chronic postnasal drip likely contributes to throat clearing and vocal cord irritation. ENT evaluation showed drainage, and examination reveals throat redness, suggesting ongoing irritation. Continue Flonase nasal spray, saline nasal irrigation, and Claritin daily. Advised on sinus hygiene.      Dyspnea - Primary   She has experienced intermittent wheezing and exertional dyspnea for one year, worsening over the past six months. The differential diagnosis includes vocal cord dysfunction and postnasal drip-related irritation.  I think this is low risk for reactive airway disease based on exam.  No significant tobacco exposure to put her at risk for chronic obstructive pulmonary disease.  Will order a chest x-ray to assess lung structure and pulmonary function testing to evaluate lung function and bronchodilator response. Advised her to return if symptoms worsen.  I think better  treatment of her chronic rhinosinusitis would probably improve the wheezing sensation she is experiencing.      Relevant Orders   DG Chest 2 View   Pulmonary function test     Return if symptoms worsen or fail to improve.  Cleatus Debby Specking, MD [facility]

## 2024-03-20 NOTE — Patient Instructions (Signed)
  VISIT SUMMARY: Today, you were seen for wheezing and difficulty breathing, especially during physical activities. We discussed your symptoms, medical history, and current medications. We also reviewed your recent ENT evaluation and your history of hyperparathyroidism.  YOUR PLAN: -WHEEZING AND EXERTIONAL DYSPNEA: Wheezing and exertional dyspnea refer to the whistling sound and shortness of breath you experience during physical activities. We will perform a chest x-ray to check your lung structure and pulmonary function tests to evaluate how well your lungs are working. Please return if your symptoms worsen.  -CHRONIC POSTNASAL DRIP AND THROAT IRRITATION: Chronic postnasal drip and throat irritation are likely causing your throat clearing and vocal cord irritation. Continue using Flonase nasal spray, saline nasal irrigation, and Claritin daily. Maintain good sinus hygiene to help manage these symptoms.  -PRIMARY HYPERPARATHYROIDISM: Primary hyperparathyroidism is a condition where your parathyroid  glands produce too much hormone, leading to high calcium levels. You are currently managing this with Evista , and we will continue to monitor your calcium levels annually.  INSTRUCTIONS: Please complete the chest x-ray and pulmonary function tests as soon as possible. Continue your current medications and sinus hygiene practices. Return to the clinic if your symptoms worsen or if you have any new concerns.

## 2024-03-21 ENCOUNTER — Ambulatory Visit (HOSPITAL_BASED_OUTPATIENT_CLINIC_OR_DEPARTMENT_OTHER)
Admission: RE | Admit: 2024-03-21 | Discharge: 2024-03-21 | Disposition: A | Discharge status: Home / Self Care | Source: Ambulatory Visit | Attending: Student in an Organized Health Care Education/Training Program | Admitting: Student in an Organized Health Care Education/Training Program

## 2024-03-21 ENCOUNTER — Telehealth: Payer: Self-pay

## 2024-03-21 DIAGNOSIS — R0609 Other forms of dyspnea: Secondary | ICD-10-CM | POA: Diagnosis not present

## 2024-03-21 NOTE — Telephone Encounter (Signed)
 Called respiratory for cone and left Vm to set up PFT for patient, Left VM to return call if any questions

## 2024-03-22 ENCOUNTER — Ambulatory Visit: Payer: Self-pay | Admitting: Student in an Organized Health Care Education/Training Program

## 2024-03-26 LAB — PARATHYROID HORMONE, INTACT (NO CA): PTH: 159 pg/mL — ABNORMAL HIGH (ref 16–77)

## 2024-03-26 LAB — RENAL FUNCTION PANEL
Albumin: 4.1 g/dL (ref 3.6–5.1)
BUN/Creatinine Ratio: 13 (calc) (ref 6–22)
BUN: 17 mg/dL (ref 7–25)
CO2: 30 mmol/L (ref 20–32)
Calcium: 11.1 mg/dL — ABNORMAL HIGH (ref 8.6–10.4)
Chloride: 106 mmol/L (ref 98–110)
Creat: 1.26 mg/dL — ABNORMAL HIGH (ref 0.50–1.05)
Glucose, Bld: 92 mg/dL (ref 65–139)
Phosphorus: 3.2 mg/dL (ref 2.5–4.5)
Potassium: 4 mmol/L (ref 3.5–5.3)
Sodium: 141 mmol/L (ref 135–146)

## 2024-03-27 NOTE — Telephone Encounter (Signed)
 Would you like this to become a stat order or to stay where it is? I can call to check on this status.

## 2024-03-27 NOTE — Telephone Encounter (Signed)
 Copied from CRM [id number]. Topic: Referral - Question >> Mar 27, 2024 11:29 AM Mariah Moss DEL wrote: Reason for CRM: Patient would like to know if there is a way that the order for her pulmonary function test could be expedited?

## 2024-03-28 NOTE — Progress Notes (Signed)
 Changed order to STAT. Willow will call office and change order verbally to STAT as well

## 2024-03-28 NOTE — Addendum Note (Signed)
 Addended by: HONOR BERN A on: 03/28/2024 11:01 AM   Modules accepted: Orders

## 2024-03-28 NOTE — Progress Notes (Signed)
 Sherry from [facility] stated that it is not quit possible to have a STAT PFT but she will try to work on getting the patient in sooner.

## 2024-03-30 ENCOUNTER — Ambulatory Visit (HOSPITAL_COMMUNITY)
Admission: RE | Admit: 2024-03-30 | Discharge: 2024-03-30 | Disposition: A | Discharge status: Home / Self Care | Source: Ambulatory Visit | Attending: Family | Admitting: Family

## 2024-03-30 DIAGNOSIS — R0609 Other forms of dyspnea: Secondary | ICD-10-CM | POA: Insufficient documentation

## 2024-03-30 LAB — PULMONARY FUNCTION TEST
DL/VA % pred: 103 %
DL/VA: 4.31 ml/min/mmHg/L
DLCO unc % pred: 89 %
DLCO unc: 18.46 ml/min/mmHg
FEF 25-75 Post: 3.03 L/s
FEF 25-75 Pre: 2.23 L/s
FEF2575-%Change-Post: 35 %
FEF2575-%Pred-Post: 135 %
FEF2575-%Pred-Pre: 100 %
FEV1-%Change-Post: 8 %
FEV1-%Pred-Post: 87 %
FEV1-%Pred-Pre: 80 %
FEV1-Post: 2.22 L
FEV1-Pre: 2.05 L
FEV1FVC-%Change-Post: 2 %
FEV1FVC-%Pred-Pre: 106 %
FEV6-%Change-Post: 7 %
FEV6-%Pred-Post: 82 %
FEV6-%Pred-Pre: 76 %
FEV6-Post: 2.63 L
FEV6-Pre: 2.44 L
FEV6FVC-%Change-Post: 2 %
FEV6FVC-%Pred-Post: 103 %
FEV6FVC-%Pred-Pre: 101 %
FVC-%Change-Post: 5 %
FVC-%Pred-Post: 79 %
FVC-%Pred-Pre: 75 %
FVC-Post: 2.64 L
FVC-Pre: 2.5 L
Post FEV1/FVC ratio: 84 %
Post FEV6/FVC ratio: 100 %
Pre FEV1/FVC ratio: 82 %
Pre FEV6/FVC Ratio: 97 %
RV % pred: 111 %
RV: 2.36 L
TLC % pred: 101 %
TLC: 5.27 L

## 2024-03-30 MED ORDER — ALBUTEROL SULFATE (2.5 MG/3ML) 0.083% IN NEBU
2.5000 mg | INHALATION_SOLUTION | Freq: Once | RESPIRATORY_TRACT | Status: AC
  Administered 2024-03-30: 2.5 mg via RESPIRATORY_TRACT

## 2024-04-01 ENCOUNTER — Other Ambulatory Visit: Payer: Self-pay | Admitting: Family Medicine

## 2024-04-01 DIAGNOSIS — F419 Anxiety disorder, unspecified: Secondary | ICD-10-CM

## 2024-04-05 ENCOUNTER — Ambulatory Visit: Admitting: Family Medicine

## 2024-04-05 ENCOUNTER — Encounter: Payer: Self-pay | Admitting: Family Medicine

## 2024-04-05 ENCOUNTER — Ambulatory Visit: Payer: Self-pay | Admitting: Family Medicine

## 2024-04-05 VITALS — BP 116/78 | HR 66 | Temp 97.9°F | Resp 18 | Ht 64.0 in | Wt 145.6 lb

## 2024-04-05 DIAGNOSIS — R0989 Other specified symptoms and signs involving the circulatory and respiratory systems: Secondary | ICD-10-CM | POA: Diagnosis not present

## 2024-04-05 DIAGNOSIS — R0609 Other forms of dyspnea: Secondary | ICD-10-CM

## 2024-04-05 DIAGNOSIS — E785 Hyperlipidemia, unspecified: Secondary | ICD-10-CM | POA: Diagnosis not present

## 2024-04-05 DIAGNOSIS — R7989 Other specified abnormal findings of blood chemistry: Secondary | ICD-10-CM

## 2024-04-05 DIAGNOSIS — I10 Essential (primary) hypertension: Secondary | ICD-10-CM

## 2024-04-05 LAB — LIPID PANEL
Cholesterol: 213 mg/dL — ABNORMAL HIGH (ref 0–200)
HDL: 72.1 mg/dL (ref >39.00)
LDL Cholesterol: 119 mg/dL — ABNORMAL HIGH (ref 0–99)
NonHDL: 140.99
Total CHOL/HDL Ratio: 3
Triglycerides: 108 mg/dL (ref 0.0–149.0)
VLDL: 21.6 mg/dL (ref 0.0–40.0)

## 2024-04-05 LAB — CBC WITH DIFFERENTIAL/PLATELET
Basophils Absolute: 0.1 K/uL (ref 0.0–0.1)
Basophils Relative: 1.9 % (ref 0.0–3.0)
Eosinophils Absolute: 0.2 K/uL (ref 0.0–0.7)
Eosinophils Relative: 4.9 % (ref 0.0–5.0)
HCT: 45.7 % (ref 36.0–46.0)
Hemoglobin: 15.5 g/dL — ABNORMAL HIGH (ref 12.0–15.0)
Lymphocytes Relative: 32.4 % (ref 12.0–46.0)
Lymphs Abs: 1.2 K/uL (ref 0.7–4.0)
MCHC: 33.9 g/dL (ref 30.0–36.0)
MCV: 97 fl (ref 78.0–100.0)
Monocytes Absolute: 0.3 K/uL (ref 0.1–1.0)
Monocytes Relative: 7.9 % (ref 3.0–12.0)
Neutro Abs: 2 K/uL (ref 1.4–7.7)
Neutrophils Relative %: 52.9 % (ref 43.0–77.0)
Platelets: 185 K/uL (ref 150.0–400.0)
RBC: 4.71 Mil/uL (ref 3.87–5.11)
RDW: 13.4 % (ref 11.5–15.5)
WBC: 3.8 K/uL — ABNORMAL LOW (ref 4.0–10.5)

## 2024-04-05 LAB — BASIC METABOLIC PANEL WITH GFR
BUN: 29 mg/dL — ABNORMAL HIGH (ref 6–23)
CO2: 23 meq/L (ref 19–32)
Calcium: 10.9 mg/dL — ABNORMAL HIGH (ref 8.4–10.5)
Chloride: 107 meq/L (ref 96–112)
Creatinine, Ser: 1.06 mg/dL (ref 0.40–1.20)
GFR: 55.59 mL/min — ABNORMAL LOW (ref >60.00)
Glucose, Bld: 77 mg/dL (ref 70–99)
Potassium: 3.9 meq/L (ref 3.5–5.1)
Sodium: 139 meq/L (ref 135–145)

## 2024-04-05 LAB — TSH: TSH: 5.71 u[IU]/mL — ABNORMAL HIGH (ref 0.35–5.50)

## 2024-04-05 LAB — HEPATIC FUNCTION PANEL
ALT: 14 U/L (ref 0–35)
AST: 21 U/L (ref 0–37)
Albumin: 4.2 g/dL (ref 3.5–5.2)
Alkaline Phosphatase: 70 U/L (ref 39–117)
Bilirubin, Direct: 0 mg/dL (ref 0.0–0.3)
Total Bilirubin: 0.4 mg/dL (ref 0.2–1.2)
Total Protein: 7.1 g/dL (ref 6.0–8.3)

## 2024-04-05 NOTE — Progress Notes (Signed)
   Subjective:    Patient ID: Mariah Moss, female    DOB: [birth year], 63 y.o.   MRN: [id number]  HPI HTN- chronic problem, on hydrochlorothiazide  12.5mg  daily w/ good control.  Denies CP, HA's, visual changes, edema.  Hyperlipidemia- ongoing issue.  Attempting to control w/ diet and exercise.  SOB on exertion- pt reports spouse and friends have commented on wheezing.  Saw ENT who reported all was well w/ exception of copious drainage.  Had PFTs done and these were read by Dr Neysa.  Were reported to be WNL but some air trapping.  No response to bronchodilator.  CXR WNL.  Is currently using a Netti pot daily, taking Zyrtec daily.   Review of Systems For ROS see HPI     Objective:   Physical Exam Vitals reviewed.  Constitutional:      General: She is not in acute distress.    Appearance: Normal appearance. She is well-developed. She is not ill-appearing.  HENT:     Head: Normocephalic and atraumatic.  Eyes:     Conjunctiva/sclera: Conjunctivae normal.     Pupils: Pupils are equal, round, and reactive to light.  Neck:     Thyroid : No thyromegaly.  Cardiovascular:     Rate and Rhythm: Normal rate and regular rhythm.     Pulses: Normal pulses.     Heart sounds: Normal heart sounds. No murmur heard. Pulmonary:     Effort: Pulmonary effort is normal. No respiratory distress.     Breath sounds: Normal breath sounds.  Abdominal:     General: There is no distension.     Palpations: Abdomen is soft.     Tenderness: There is no abdominal tenderness.  Musculoskeletal:     Cervical back: Normal range of motion and neck supple.     Right lower leg: No edema.     Left lower leg: No edema.  Lymphadenopathy:     Cervical: No cervical adenopathy.  Skin:    General: Skin is warm and dry.  Neurological:     General: No focal deficit present.     Mental Status: She is alert and oriented to person, place, and time.  Psychiatric:        Mood and Affect: Mood normal.        Behavior:  Behavior normal.        Thought Content: Thought content normal.           Assessment & Plan:

## 2024-04-05 NOTE — Patient Instructions (Signed)
 Schedule your complete physical in 6 months We'll notify you of your lab results and make any changes if needed We'll call you to schedule your Allergy appt Keep up the good work on healthy diet and regular exercise- you look great! Call with any questions or concerns Stay Safe!  Stay Healthy! Happy Holidays!!!

## 2024-04-06 ENCOUNTER — Ambulatory Visit (INDEPENDENT_AMBULATORY_CARE_PROVIDER_SITE_OTHER)

## 2024-04-06 DIAGNOSIS — R7989 Other specified abnormal findings of blood chemistry: Secondary | ICD-10-CM | POA: Diagnosis not present

## 2024-04-06 DIAGNOSIS — R946 Abnormal results of thyroid function studies: Secondary | ICD-10-CM

## 2024-04-06 LAB — T3, FREE: T3, Free: 3 pg/mL (ref 2.3–4.2)

## 2024-04-06 LAB — T4, FREE: Free T4: 0.77 ng/dL (ref 0.60–1.60)

## 2024-04-09 ENCOUNTER — Ambulatory Visit: Payer: Self-pay | Admitting: Family Medicine

## 2024-04-15 ENCOUNTER — Other Ambulatory Visit: Payer: Self-pay | Admitting: Family Medicine

## 2024-04-15 DIAGNOSIS — E039 Hypothyroidism, unspecified: Secondary | ICD-10-CM

## 2024-04-22 NOTE — Assessment & Plan Note (Signed)
 Ongoing issue for pt.  In the midst of a pulmonary work up but so far results have been normal.  She is on Zyrtec and using a Netti pot daily but still having copious drainage.  Saw ENT who felt structurally things were normal.  Suspect the wheezing she hears is upper airway and likely due to insufficiently tx'd allergies.  Refer to Allergy for a complete work up.  Pt expressed understanding and is in agreement w/ plan.

## 2024-04-22 NOTE — Assessment & Plan Note (Signed)
 Chronic problem.  On hydrochlorothiazide  12.5mg  daily w/ good control.  Currently asymptomatic.  Check labs due to diuretic use but no anticipated med changes.

## 2024-04-22 NOTE — Assessment & Plan Note (Signed)
Chronic problem.  Has been attempting to control w/ diet and exercise.  Check labs and determine if medication is needed 

## 2024-04-24 ENCOUNTER — Ambulatory Visit
Admission: RE | Admit: 2024-04-24 | Discharge: 2024-04-24 | Disposition: A | Source: Ambulatory Visit | Attending: Endocrinology

## 2024-04-24 DIAGNOSIS — M85851 Other specified disorders of bone density and structure, right thigh: Secondary | ICD-10-CM

## 2024-04-24 DIAGNOSIS — E210 Primary hyperparathyroidism: Secondary | ICD-10-CM | POA: Diagnosis not present

## 2024-04-24 DIAGNOSIS — M85852 Other specified disorders of bone density and structure, left thigh: Secondary | ICD-10-CM

## 2024-04-25 ENCOUNTER — Ambulatory Visit (INDEPENDENT_AMBULATORY_CARE_PROVIDER_SITE_OTHER): Admitting: Endocrinology

## 2024-04-25 ENCOUNTER — Encounter: Payer: Self-pay | Admitting: Endocrinology

## 2024-04-25 VITALS — BP 118/72 | HR 52 | Resp 16 | Ht 64.0 in | Wt 148.4 lb

## 2024-04-25 DIAGNOSIS — E210 Primary hyperparathyroidism: Secondary | ICD-10-CM | POA: Diagnosis not present

## 2024-04-25 DIAGNOSIS — E559 Vitamin D deficiency, unspecified: Secondary | ICD-10-CM | POA: Diagnosis not present

## 2024-04-25 DIAGNOSIS — M810 Age-related osteoporosis without current pathological fracture: Secondary | ICD-10-CM | POA: Diagnosis not present

## 2024-04-25 MED ORDER — ALENDRONATE SODIUM 70 MG PO TABS: 70.0000 mg | ORAL_TABLET | ORAL | 4 refills | Status: AC

## 2024-04-25 NOTE — Progress Notes (Signed)
 Outpatient Endocrinology Note Mariah Ressler, Mariah Moss   Patient's Name: Mariah Moss    DOB: [birth year]    MRN: [id number]  REASON OF VISIT: Follow up for primary hyperparathyroidism / Osteopenia  PCP: Mahlon Comer BRAVO, Mariah Moss  HISTORY OF PRESENT ILLNESS:   Mariah Moss is a 63 y.o. old female with past medical history listed below, is here for follow up of primary hyperparathyroidism and bone health issues / osteopenia.  Pertinent Bone Health History: Patient was previously seen by Dr. Von and was last time seen in July 2024. Patient is known to have primary hyperparathyroidism, being monitored conservatively.   Patient was evaluated in the initial consult in this clinic by Dr. Von in July 2018.  Patient had serum calcium in the range of 10.6 to 11.5 and had elevated parathyroid  hormone of 121 in 2018.  She was taking hydrochlorothiazide  and hydrochlorothiazide  was decreased from 25 to 12.5 mg daily.  She has been asymptomatic with no kidney stone and fracture.  No family history of kidney stone.  Ultrasound thyroid  in July 2018 reviewed images diffusely heterogenous thyroid  parenchyma, no large definitive solid nodule, possible right upper pole 0.4 cm hyperechoic nodule.  She had taken trial of levothyroxine  50 mcg daily in the past, did not have any symptoms difference, has been off of the levothyroxine  since 2019.  She has normal thyroid  function test off of levothyroxine  since then.  # Primary hyperparathyroidism : Additional workup in mid of 2025 completed for primary hyperparathyroidism : - 24-hour urine calcium normal 142. - Imaging localizing studies for parathyroid  gland: 1) in December 14, 2023 :ultrasound thyroid  showed diffusely heterogenous thyroid  parenchyma, no thyroid  nodule or parathyroid  adenoma identified. 2) on January 10, 2024: Parathyroid  nuclear scan with SPECT : No clear evidence of parathyroid  adenoma.  # Osteopenia / osteoporosis:  Menopause at occurred at about age 36.  No smoking, alcohol use, glucocorticoid use.   She was started on Evista  for her hip osteopenia with baseline T score -1.8 She has been taking this since June 2019 Does not complain of any hot flashes She is exercising regularly   Bone density in 11/22 was minimally lower at the femoral neck at -2.0   Results:  04/02/2021 Lumbar spine L1-L4(L3) Femoral neck (FN)  T-score  -0.8 RFN: -1.8 LFN: -2.0  Change in BMD from previous DXA test (%)   -2%    Previous bone densities: 11/2018:     Lumbar spine L1-L4 (L3) Femoral neck (FN) 33% distal radius Ultra distal radius  T-score -0.8 RFN: -1.8 LFN: -1.9 -1.8 -2.5  Change in BMD from previous DXA test (%)  0.0%  -0.6%  -4.4% n/a    Baseline bone density done on 12/21/16 results:     Lumbar spine L1-L4 (L3) Femoral neck (FN) 33% distal R radius  T-score -0.8 RFN: -1.8 LFN: -1.7 -1.5      VITAMIN D  deficiency:   25 (OH) Vitamin D  level was low before starting supplementation, normalized after vitamin D3 supplement 2000 international unit daily.   Interval history  Patient had parathyroid  localizing imaging studies with ultrasound neck/parathyroid  gland and nuclear sestamibi scan, no clear evidence of parathyroid  adenoma.  Recent lab in mid of November with corrected serum calcium of 10.7, with serum calcium of 10.9 and albumin of 4.2.  Stable renal function with eGFR 55.  Patient has been taking vitamin D3 1000 international unit daily, decreased from 2000 international unit daily in the last visit.  No calcium supplement she has been  eating dietary calcium in the form of cheese, yogurt.  She has no fall and fracture.  No GERD symptoms.  No kidney stone.  No bony pain or fatigue.  She has complaints of somewhat frequent urination otherwise no new complaints.  She has also been taking hydrochlorothiazide  for hypertension.  She has CKD with eGFR in 50s.  CKD 3A.  She has been taking Evista  raloxifene  for osteopenia/osteoporosis  management.  She had DEXA scan yesterday and results awaited.  REVIEW OF SYSTEMS:  As per history of present illness.   PAST MEDICAL HISTORY: Past Medical History:  Diagnosis Date   Anxiety    Heart murmur    Hypertension     PAST SURGICAL HISTORY: Past Surgical History:  Procedure Laterality Date   BUNIONECTOMY  05/24/1998   Bunnonectomy  Right 03/2022   COLONOSCOPY  2011   in [place], [place] -Normal exam    ALLERGIES: No Known Allergies  FAMILY HISTORY:  Family History  Problem Relation Age of Onset   Heart disease Mother    Stroke Mother    Hypertension Mother    Cancer Father    Diabetes Father    Thyroid  disease Neg Hx    Colon cancer Neg Hx    Esophageal cancer Neg Hx    Stomach cancer Neg Hx     SOCIAL HISTORY: Social History   Socioeconomic History   Marital status: Married    Spouse name: Not on file   Number of children: Not on file   Years of education: Not on file   Highest education level: Not on file  Occupational History   Not on file  Tobacco Use   Smoking status: Never   Smokeless tobacco: Never  Vaping Use   Vaping status: Never Used  Substance and Sexual Activity   Alcohol use: Yes    Alcohol/week: 3.0 standard drinks of alcohol    Types: 3 Standard drinks or equivalent per week    Comment: occasionally   Drug use: No   Sexual activity: Not Currently  Other Topics Concern   Not on file  Social History Narrative   Not on file   Social Drivers of Health   Financial Resource Strain: Not on file  Food Insecurity: Not on file  Transportation Needs: Not on file  Physical Activity: Not on file  Stress: Not on file  Social Connections: Not on file    MEDICATIONS:  Current Outpatient Medications  Medication Sig Dispense Refill   alendronate (FOSAMAX) 70 MG tablet Take 1 tablet (70 mg total) by mouth every 7 (seven) days. Take with a full glass of water on an empty stomach. 12 tablet 4   cetirizine (ZYRTEC) 5 MG chewable  tablet Chew 5 mg by mouth daily.     citalopram  (CELEXA ) 20 MG tablet TAKE 1 TABLET BY MOUTH EVERY DAY 90 tablet 0   hydrochlorothiazide  (HYDRODIURIL ) 12.5 MG tablet TAKE 1 TABLET BY MOUTH EVERY DAY 90 tablet 1   Vitamin D , Cholecalciferol, 50 MCG (2000 UT) CAPS Take by mouth.     No current facility-administered medications for this visit.    PHYSICAL EXAM: Vitals:   04/25/24 1015  BP: 118/72  Pulse: (!) 52  Resp: 16  SpO2: 97%  Weight: 148 lb 6.4 oz (67.3 kg)  Height: 5' 4 (1.626 m)    Body mass index is 25.47 kg/m.  Wt Readings from Last 3 Encounters:  04/25/24 148 lb 6.4 oz (67.3 kg)  04/05/24 145 lb 9.6 oz (66  kg)  03/20/24 147 lb (66.7 kg)    General: Well developed, well nourished female in no apparent distress.  HEENT: AT/[place], no external lesions. Hearing intact to the spoken word Eyes: EOMI. Conjunctiva clear and no icterus. Neck: Trachea midline, neck supple without appreciable thyromegaly or lymphadenopathy and no palpable thyroid  nodules Lungs: Clear to auscultation, no wheeze. Respirations not labored Heart: S1S2, Regular in rate and rhythm.  Abdomen: Soft, non tender, non distended Neurologic: Alert, oriented, normal speech, deep tendon biceps reflexes normal,  no gross focal neurological deficit Extremities: No pedal pitting edema, no tremors of outstretched hands. No spine tenderness Skin: Warm, color good.  Psychiatric: Does not appear depressed or anxious  PERTINENT HISTORIC LABORATORY AND IMAGING STUDIES:  All pertinent laboratory results were reviewed. Please see HPI also for further details.   Lab Results  Component Value Date   ALKPHOS 70 04/05/2024   ALKPHOS 68 09/30/2023   ALKPHOS 66 09/29/2022     ASSESSMENT / PLAN  1. Primary hyperparathyroidism   2. Age-related osteoporosis without current pathological fracture   3. Vitamin D  deficiency     # Primary hyperparathyroidism : - Patient has hypercalcemia with in the range of 10.6-11.5, at  least from 2013, had elevated parathyroid  hormone of 120 -159, consistent with primary hyperparathyroidism.  This is being conservatively observed.  No kidney stone.  No fracture.  No obvious risk factors.  She has osteopenia.  -With the lab reviewed noted to have patient has CKD 3A with EGFR mostly in the upper 50s range, has remained stable over few years.  No obvious cause to cause CKD, she has controlled blood pressure on 1 antihypertensive medication.  - Previously patient preferred to follow conservatively and wanted to avoid surgical treatment as much as possible.  However with a discussion patient wanted to have parathyroid  localization study to consider parathyroid  surgery.  Discussed that definitive treatment of primary hyperparathyroidism is parathyroidectomy/surgical treatment.  She had completed ultrasound neck and sestamibi parathyroid  nuclear scan in July and August 2025 with no obvious parathyroid  adenoma.  She had acceptable 24-hour urine calcium of 142.  Patient has nonlocalizing parathyroid  adenoma and relatively stable serum calcium, overall laboratory results are still consistent with having mild primary hyperparathyroidism.  Plan: - Discussed option of further diagnostic testing and possibly surgical exploration due to nonlocalizing parathyroid  adenoma versus continue observation.  Patient preferred for observation.  In the context of relatively stable mild hypercalcemia and stable renal function is okay to monitor for now. -Recent serum calcium mildly elevated 10.7 corrected.  Advised patient for well hydration.  Avoid dehydration. - Advised patient to discuss with primary care provider to stop hydrochlorothiazide  which can worsen hypercalcemia and treat with different antihypertensive medication. - Will continue to monitor serum calcium related labs, every 6 months for now.  # Osteopenia : - She has been on Evista  60 mg daily since 2019 for osteopenia. - Last DEXA scan was in  November 2022 with lowest T-score of -2.0.  Plan: - She has been on vitamin D  supplement, 1000 interestingly daily.  She is on dietary calcium supplement. - Keep vitamin D  level in the low normal range around 30 due to hypercalcemia. - Continue Evista  60 mg daily for now.  If her bone mineral density is worsening we will switch to bisphosphonate antiresorptive therapy for bone health management. Follow-up DEXA scan results completed yesterday - Discussed about weightbearing exercise and fall precautions.  Addendum: DEXA scan report as reviewed below consistent with osteoporosis with T-score  of -3.3 at distal radius, bone mineral density especially at the radius slightly worsening.  Improvement on bone mineral density of the spine may have also been affected by degenerative changes.  I would like to start Fosamax 70 mg weekly.  Stop Evista .  She has CKD will be cautious with renal function.  Will monitor.  I called patient, no answer left voice message to call back.  Diagnoses and all orders for this visit:  Primary hyperparathyroidism -     Renal function panel -     Parathyroid  hormone, intact (no Ca)  Age-related osteoporosis without current pathological fracture -     alendronate (FOSAMAX) 70 MG tablet; Take 1 tablet (70 mg total) by mouth every 7 (seven) days. Take with a full glass of water on an empty stomach.  Vitamin D  deficiency -     VITAMIN D  25 Hydroxy (Vit-D Deficiency, Fractures)   DISPOSITION Follow up in clinic in 6 months suggested.  Labs prior to follow-up visit as ordered.  All questions answered and patient verbalized understanding of the plan.  Silvestre Mines, Mariah Moss [facility] [address] [place], [address] [place], [place] [address] Phone # [phone]  At least part of this note was generated using voice recognition software. Inadvertent word errors may have occurred, which were not recognized during the proofreading  process.    Addendum: DEXA scan report reviewed : Consistent with osteoporosis based on T-score of -3.3 at all right distal radius.  Overall improvement on bone mineral density in the lumbar spine and possibly at hips and possibly stable bone normal density at distal radius.     Date of study: 04/24/2024 Exam: DUAL X-RAY ABSORPTIOMETRY (DXA) FOR BONE MINERAL DENSITY (BMD) Instrument: [employer] Requesting Provider: Dr. Mercie Indication: follow up for low BMD in a patient with primary hyperparathyroidism Comparison: none (please note that it is not possible to compare data from different instruments) Clinical data: Pt is a 63 y.o. female without previous history of fragility (osteoporotic) fracture. On Evista , vitamin D .   Results:   Lumbar spine L1-L4 (L3) Femoral neck (FN) 33% distal radius Ultra distal radius  T-score -0.3 RFN: -1.9 LFN: -1.9 -2.4 -3.3  Change in BMD from previous DXA test (%) +6.4%* +0.3% -5.3% N/a  (*) statistically significant   Assessment: the BMD is in the osteoporotic range according to the [facility] classification for osteoporosis (see below). Fracture risk: high  Comments: the technical quality of the study is good,  however, L3 vertebra had to be excluded from analysis due to degenerative changes. Ultradistal radius can be used as a surrogate site for BMD analysis of trabecular bone instead of the spine.

## 2024-05-02 NOTE — Telephone Encounter (Signed)
 Patient states her endocrinologist Dr.Thalia suggest a change in BP medication due to the medication she is on increases calcium in blood and stated her calcium is already too high. Would you like patient to make an appointment to discuss this?

## 2024-05-03 MED ORDER — AMLODIPINE BESYLATE 5 MG PO TABS: 5.0000 mg | ORAL_TABLET | Freq: Every day | ORAL | 3 refills | Status: AC

## 2024-05-07 ENCOUNTER — Encounter: Payer: Self-pay | Admitting: Internal Medicine

## 2024-05-07 ENCOUNTER — Ambulatory Visit: Admitting: Internal Medicine

## 2024-05-07 VITALS — BP 132/90 | HR 68 | Temp 97.5°F | Resp 18 | Ht 65.3 in | Wt 147.0 lb

## 2024-05-07 DIAGNOSIS — K219 Gastro-esophageal reflux disease without esophagitis: Secondary | ICD-10-CM

## 2024-05-07 DIAGNOSIS — R0982 Postnasal drip: Secondary | ICD-10-CM | POA: Diagnosis not present

## 2024-05-07 DIAGNOSIS — R0609 Other forms of dyspnea: Secondary | ICD-10-CM | POA: Diagnosis not present

## 2024-05-07 DIAGNOSIS — J387 Other diseases of larynx: Secondary | ICD-10-CM | POA: Diagnosis not present

## 2024-05-07 MED ORDER — FAMOTIDINE 20 MG PO TABS: 20.0000 mg | ORAL_TABLET | Freq: Two times a day (BID) | ORAL | 5 refills | Status: AC

## 2024-05-07 MED ORDER — IPRATROPIUM BROMIDE 0.06 % NA SOLN: 2.0000 | Freq: Four times a day (QID) | NASAL | 12 refills | Status: AC

## 2024-05-07 NOTE — Progress Notes (Signed)
 NEW PATIENT Date of Service/Encounter:  05/07/2024 Referring provider: Mahlon Comer BRAVO, MD Primary care provider: Mahlon Comer BRAVO, MD  Subjective:  Mariah Moss is a 63 y.o. female  presenting today for evaluation of shortness of breath, throat discomfort  History obtained from: chart review and patient.   Discussed the use of AI scribe software for clinical note transcription with the patient, who gave verbal consent to proceed.  History of Present Illness Mariah Moss is a 63 year old female who presents with shortness of breath and a whistling sound during breathing.  Dyspnea and wheezing - Shortness of breath and whistling sound during breathing present for approximately one year, worsening over the last six months - Whistling sound described as a wheeze or straining; friends comment it sounds like 'breathing through a filter' - Symptoms are exertion-induced, occurring during activities such as playing pickleball or performing jumping jacks - Intermittent symptoms, not constant - No voice changes or hoarseness - Feels the need to take deep breaths during conversations, as if not enough air to continue speaking - No issues with exercise tolerance; does not need to stop and rest during activities  Throat clearing and postnasal drip - Frequent throat clearing associated with postnasal drip - ENT evaluation in January showed drainage but no polyps or nodules on vocal cords - Routine use of neti pot and throat clearing; unsure of effectiveness - Productive cough in the mornings - No cough at night  Allergy and medication response - Tried Zyrtec and Claritin without significant improvement  Gastrointestinal symptoms - No history of reflux or heartburn - No waking with bad taste in mouth or sore throat - no prior treatment for GERD   Pulmonary evaluation - Pulmonary workup including scans and x-rays has been normal  Infectious history - Mild case of COVID-19 in  2021, does not believe it is related to current symptoms    Chart Review:  Full PFTS: 03/30/24: FVC 2.50 L, 75%; FEV1 2.05 L, 80%; ratio 82%; post: FVC 2.65 L, 79% FEV1 2.22 L, 87%; (no significant postbronchodilator response) normal RV, TLC, DLCO  Chest x-ray open this is 03/22/2024): Normal  ENT 05/31/23: laryngoscopy - Procedure: After adequate topical anesthetic was applied, 2 mm flexible laryngoscope was passed through the nasal cavity without difficulty. Flexible laryngoscopy shows patent anterior nasal cavity with minimal crusting, no discharge or infection.  Normal base of tongue and supraglottis Normal vocal cord mobility without vocal cord nodule, mass, polyp or tumor. Hypopharynx shows minimal erythema of posterior commissure without mass, pooling of secretions or aspiration.   Other allergy screening: Asthma: no Rhino conjunctivitis: yes Food allergy: no Medication allergy: no Hymenoptera allergy: no Urticaria: no Eczema:no History of recurrent infections suggestive of immunodeficency: no Vaccinations are up to date.   Past Medical History: Past Medical History:  Diagnosis Date   Anxiety    Heart murmur    Hypertension    Medication List:  Current Outpatient Medications  Medication Sig Dispense Refill   alendronate  (FOSAMAX ) 70 MG tablet Take 1 tablet (70 mg total) by mouth every 7 (seven) days. Take with a full glass of water on an empty stomach. 12 tablet 4   amLODipine  (NORVASC ) 5 MG tablet Take 1 tablet (5 mg total) by mouth daily. 30 tablet 3   cetirizine (ZYRTEC) 5 MG chewable tablet Chew 5 mg by mouth daily.     citalopram  (CELEXA ) 20 MG tablet TAKE 1 TABLET BY MOUTH EVERY DAY 90 tablet 0   Vitamin D ,  Cholecalciferol, 50 MCG (2000 UT) CAPS Take by mouth.     hydrochlorothiazide  (HYDRODIURIL ) 12.5 MG tablet TAKE 1 TABLET BY MOUTH EVERY DAY (Patient not taking: Reported on 05/07/2024) 90 tablet 1   No current facility-administered medications for this visit.    Known Allergies:  Allergies[1] Past Surgical History: Past Surgical History:  Procedure Laterality Date   BUNIONECTOMY  05/24/1998   Bunnonectomy  Right 03/2022   COLONOSCOPY  2011   in [place], [place] -Normal exam   Family History: Family History  Problem Relation Age of Onset   Heart disease Mother    Stroke Mother    Hypertension Mother    Cancer Father    Diabetes Father    Thyroid  disease Neg Hx    Colon cancer Neg Hx    Esophageal cancer Neg Hx    Stomach cancer Neg Hx    Social History: Mariah Moss lives home this 63 years old.  No water damage.  Carpet throughout.  Gas heating central cooling.  Dog with access to bedroom.  No roaches in the house and bed is 2 feet off floor.  No dust mite precautions.  Is retired..   ROS:  All other systems negative except as noted per HPI.  Objective:  Blood pressure (!) 132/90, pulse 68, temperature (!) 97.5 F (36.4 C), resp. rate 18, height 5' 5.3 (1.659 m), weight 147 lb (66.7 kg), last menstrual period 12/14/2011, SpO2 98%. Body mass index is 24.24 kg/m. Physical Exam:  General Appearance:  Alert, cooperative, no distress, appears stated age  Head:  Normocephalic, without obvious abnormality, atraumatic  Eyes:  Conjunctiva clear, EOM's intact  Ears EACs normal bilaterally and normal TMs bilaterally  Nose: Nares normal, erythematous nasal mucosa , no visible anterior polyps, and septum midline  Throat: Lips, tongue normal; teeth and gums normal, + cobblestoning  Neck: Supple, symmetrical  Lungs:   clear to auscultation bilaterally, Respirations unlabored, no coughing  Heart:  regular rate and rhythm and no murmur, Appears well perfused  Extremities: No edema  Skin: Skin color, texture, turgor normal and no rashes or lesions on visualized portions of skin  Neurologic: No gross deficits   Diagnostics: None done    Labs:  Lab Orders  No laboratory test(s) ordered today     Assessment and Plan  Assessment and  Plan Assessment & Plan Inducible laryngeal obstruction (vocal cord dysfunction) Exertion-induced shortness of breath and airway obstruction suggest ILO/VCD. Symptoms mimic asthma/anaphylaxis but are non-life-threatening. Treatable with breathing exercises. - Start breathing exercises as below   Postnasal drip Contributes to throat clearing and airway obstruction. Managed with nasal steroids and Atrovent . - Return for allergy testing (1-55)   -hold all antihistamines for 3 days prior, including pepcid   - Start Flonase 1 spray per nostril nasal spray twice daily. - START Atrovent  (ipatopium) nasal spray 1-2 sprays in each nostril up to three times daily AS NEEDED for POST NASAL DRIP/RUNNY NOSE/DRAINAGE.  If you become too dry, use less often. - Continue neti pot sinus rinses 10-15 minute before nasal sprays.  Laryngopharyngeal reflux ENT scope indicated redness suggestive of LPR. Symptoms may worsen with nocturnal acid exposure. - Prescribed Pepcid   (famotidine ) 10mg  twice daily   - hold for 3 days prior to allergy test - Advised to check [employer] for cost-effective Pepcid  options if not coverd by insurance   Follow up: or allergy testing (1-55)  -hold all antihistamines for 3 days prior       This note in its entirety  was forwarded to the Provider who requested this consultation.  Other:    Thank you for your kind referral. I appreciate the opportunity to take part in [facility] care. Please do not hesitate to contact me with questions.  Sincerely,  Thank you so much for letting me partake in your care today.  Don't hesitate to reach out if you have any additional concerns!  Hargis Springer, MD  [facility] , [place]          [1] No Known Allergies

## 2024-05-07 NOTE — Patient Instructions (Addendum)
 Inducible laryngeal obstruction (vocal cord dysfunction) Exertion-induced shortness of breath and airway obstruction suggest ILO/VCD. Symptoms mimic asthma/anaphylaxis but are non-life-threatening. Treatable with breathing exercises. - Start breathing exercises as below   Postnasal drip Contributes to throat clearing and airway obstruction. Managed with nasal steroids and Atrovent . - Return for allergy testing (1-55)   -hold all antihistamines for 3 days prior, including pepcid   - Start Flonase 1 spray per nostril nasal spray twice daily. - START Atrovent  (ipatopium) nasal spray 1-2 sprays in each nostril up to three times daily AS NEEDED for POST NASAL DRIP/RUNNY NOSE/DRAINAGE.  If you become too dry, use less often. - Continue neti pot sinus rinses 10-15 minute before nasal sprays.  Laryngopharyngeal reflux ENT scope indicated redness suggestive of LPR. Symptoms may worsen with nocturnal acid exposure. - Prescribed Pepcid   (famotidine ) 10mg  twice daily   - hold for 3 days prior to allergy test - Advised to check [employer] for cost-effective Pepcid  options if not coverd by insurance   Follow up: or allergy testing (1-55)  -hold all antihistamines for 3 days prior   Vocal Cord Dysfunction (VCD) /Inspiratory Laryngeal Obstruction (ILO)/ Exercise- Induced Laryngeal Obstruction Exercises (EILO)  Practice these techniques several times a day, so that when you need them, they will be automatic, and will work right away (or very fast) to open up the spasming (closed) vocal cords.  PREPARATION: ? Loosen clothing at waist, so nothing is tight or snug. Open top buttons of pants or skirt, unzip pant or skirt zippers, pull shirt out over pants or skirt. This prevents pressure on the stomach, which can cause stomach reflux (backup of corrosive liquid) into the esophagus. Gastric reflux is a frequent cause of VCD attacks. ? Sit in a comfortable chair. When you need to use these techniques, you may  be standing, lying, or sitting-BUT first try to learn them while sitting because they are easier to learn in this position. ? Keep water handy. Take sips, so your mouth and throat will not dry out. Swallow carefully, to avoid choking. ? Put your right (or left) thumb on your belly button, with the rest of your fingers below the thumb, so you are GENTLY touching your belly (lower abdomen). ? Doing this will focus your attention on your lower abdominal muscles. ? Relax your chest. Relax your shoulders. Relax your neck. Relax your throat. This helps you to try to use ONLY your belly (lower abdomen) muscles. Consciously try NOT to use chest muscles, etc. Chest muscle use seems to irritate vocal cords while belly muscles tend to relax them.  BREATHE OUT (EXHALE) FIRST WITH slightly PURSED LIPS: ? Since most people cannot inhale, (breathe in), during VCD attacks, please start by breathing out (exhaling) in the special way described below, and this will usually open up the vocal cords, immediately. ? Start, by breathing out (exhaling) with lips pursed as if you are trying to gently blow out a candle. ? This is similar to whistling, but your lips will not be as puckered as when whistling and your lips will not be pushed forward, like when whistling. If you look in a mirror, you would see a mostly horizontal line of space between the lips, while you exhale the ffffffffffffffffffff. ? This may be silent, or may sound like a gentle wind or breeze, like fffffffffffffff and your lips should be symmetrical, around your teeth. You lower lip will not be touching your upper teeth, like when someone says the name FFFFFFFRank. This is one  continuous flow of exhaled air, either silent, or making a slightly windy sound. ? Feel your hand on your belly (lower abdomen) come IN, a little bit toward your back, as you are working only your lower belly muscles. ? This abdominal exhaled fffffffffffffffff alone  often stops VCD attacks very quickly.  ? Some prefer to try a variation of exhaling ffffffffffff. Try exhaling quickly, ffff, fffff, ffff--all part of one exhalation. Do not inhale in between quick fffs. This helps some people more quickly open up the spasming vocal cords. This is like blowing out a slightly stubborn candle, exhaling against a tiny bit of resistance (like trying to blow out a trick birthday candle). ? If any of this helps, try to slow down the exhalations, and gently exhale ffffffffffffffffff. ? Some people prefer to exhale gently ssssssssssssssss or shhhhhhhhhhhhh rather than fffffffffffffff, etc. Choose what works best in your case, or what is most comfortable for you. ? You may need to repeat exhaling ffffffffffffff (or ffff, ffff, ffff), etc, several times to relax the spasming vocal cords. Repeating this often stops a VCD attack so that you can inhale (breathe in) again.

## 2024-05-14 ENCOUNTER — Ambulatory Visit: Admitting: Internal Medicine

## 2024-05-14 DIAGNOSIS — J3089 Other allergic rhinitis: Secondary | ICD-10-CM | POA: Diagnosis not present

## 2024-05-14 DIAGNOSIS — J302 Other seasonal allergic rhinitis: Secondary | ICD-10-CM | POA: Diagnosis not present

## 2024-05-14 NOTE — Patient Instructions (Addendum)
 Inducible laryngeal obstruction (vocal cord dysfunction) Exertion-induced shortness of breath and airway obstruction suggest ILO/VCD. Symptoms mimic asthma/anaphylaxis but are non-life-threatening. Treatable with breathing exercises. - Continue breathing exercises   Postnasal drip Contributes to throat clearing and airway obstruction. Managed with nasal steroids and Atrovent . Allergy  test (05/14/24): grass, weed, tree, mold, cockroach  - Start daily anithistamine: options include Zyrtec (cetirizine), Allegra (fexofenadine), Xyzal (levocetirizine), Claritin (loratadine)  -Generic is fine and can buy cheaply off [employer], [employer], [employer], [employer] - Continue Flonase 1 spray per nostril nasal spray twice daily. - Continue  Atrovent  (ipatopium) nasal spray 1-2 sprays in each nostril up to three times daily as needed for drainage/post nasal drip  - Continue neti pot sinus rinses 10-15 minute before nasal sprays.  Laryngopharyngeal reflux ENT scope indicated redness suggestive of LPR. Symptoms may worsen with nocturnal acid exposure. - Continue Pepcid   (famotidine ) 10mg  twice daily    Follow up: 4 months (around April)   Reducing Pollen Exposure  The American Academy of Allergy , Asthma and Immunology suggests the following steps to reduce your exposure to pollen during allergy  seasons.    Do not hang sheets or clothing out to dry; pollen may collect on these items. Do not mow lawns or spend time around freshly cut grass; mowing stirs up pollen. Keep windows closed at night.  Keep car windows closed while driving. Minimize morning activities outdoors, a time when pollen counts are usually at their highest. Stay indoors as much as possible when pollen counts or humidity is high and on windy days when pollen tends to remain in the air longer. Use air conditioning when possible.  Many air conditioners have filters that trap the pollen spores. Use a HEPA room air filter to remove pollen form the  indoor air you breathe.  Control of Mold Allergen   Mold and fungi can grow on a variety of surfaces provided certain temperature and moisture conditions exist.  Outdoor molds grow on plants, decaying vegetation and soil.  The major outdoor mold, Alternaria and Cladosporium, are found in very high numbers during hot and dry conditions.  Generally, a late Summer - Fall peak is seen for common outdoor fungal spores.  Rain will temporarily lower outdoor mold spore count, but counts rise rapidly when the rainy period ends.  The most important indoor molds are Aspergillus and Penicillium.  Dark, humid and poorly ventilated basements are ideal sites for mold growth.  The next most common sites of mold growth are the bathroom and the kitchen.  Outdoor (Seasonal) Mold Control  Use air conditioning and keep windows closed Avoid exposure to decaying vegetation. Avoid leaf raking. Avoid grain handling. Consider wearing a face mask if working in moldy areas.    Indoor (Perennial) Mold Control   Maintain humidity below 50%. Clean washable surfaces with 5% bleach solution. Remove sources e.g. contaminated carpets.   Control of Cockroach Allergen  Cockroach allergen has been identified as an important cause of acute attacks of asthma, especially in urban settings.  There are fifty-five species of cockroach that exist in the [place] , however only three, the American, German and Oriental species produce allergen that can affect patients with Asthma.  Allergens can be obtained from fecal particles, egg casings and secretions from cockroaches.    Remove food sources. Reduce access to water. Seal access and entry points. Spray runways with 0.5-1% Diazinon or Chlorpyrifos Blow boric acid power under stoves and refrigerator. Place bait stations (hydramethylnon) at feeding sites.

## 2024-05-14 NOTE — Progress Notes (Signed)
 " Date of Service/Encounter:  05/14/2024  Allergy  testing appointment   Initial visit on 05/07/24, seen for post nasal drip, LPRD, ILO .  Please see that note for additional details.  Today reports for allergy  diagnostic testing:    DIAGNOSTICS:  Skin Testing: Environmental allergy  panel. Adequate positive and negative controls Results discussed with patient/family.  Airborne Adult Perc - 05/14/24 1119     Time Antigen Placed 1119    Allergen Manufacturer Jestine    Location Back    Number of Test 55    1. Control-Buffer 50% Glycerol Negative    2. Control-Histamine 3+    3. Bahia Negative    4. Bermuda 2+    5. Johnson 2+    6. [place]  Blue Negative    7. Meadow Fescue Negative    8. Perennial Rye Negative    9. Timothy Negative    10. Ragweed Mix Negative    11. Cocklebur Negative    12. Plantain,  English 2+    13. Baccharis 2+    14. Dog Fennel Negative    15. Russian Thistle Negative    16. Lamb's Quarters Negative    17. Sheep Sorrell Negative    18. Rough Pigweed Negative    19. Marsh Elder, Rough Negative    20. Mugwort, Common Negative    21. Box, Elder 2+    22. Cedar, red 3+    23. Sweet Gum 2+    24. Pecan Pollen 2+    25. Pine Mix 2+    26. Walnut, Black Pollen Negative    27. Red Mulberry Negative    28. Ash Mix 2+    29. Birch Mix 2+    30. Beech American 3+    31. Cottonwood, Eastern Negative    32. Hickory, White Negative    33. Maple Mix Negative    34. Oak, Eastern Mix Negative    35. Sycamore Eastern Negative    36. Alternaria Alternata Negative    37. Cladosporium Herbarum Negative    38. Aspergillus Mix Negative    39. Penicillium Mix Negative    40. Bipolaris Sorokiniana (Helminthosporium) Negative    41. Drechslera Spicifera (Curvularia) Negative    42. Mucor Plumbeus Negative    43. Fusarium Moniliforme Negative    44. Aureobasidium Pullulans (pullulara) 2+    45. Rhizopus Oryzae 2+    46. Botrytis Cinera 2+    47. Epicoccum  Nigrum Negative    48. Phoma Betae 2+    49. Dust Mite Mix Negative    50. Cat Hair 10,000 BAU/ml Negative    51.  Dog Epithelia Negative    52. Mixed Feathers Negative    53. Horse Epithelia Negative    54. Cockroach, German 3+    55. Tobacco Leaf Negative          Intradermal - 05/14/24 1153     Time Antigen Placed 1153    Allergen Manufacturer Greer    Location Arm    Number of Test 11    Control Negative    Bahia Negative    7 Grass Negative    Ragweed Mix Negative    Weed Mix Negative    Tree Mix Negative    Mold 1 Negative    Mold 2 Negative    Mold 3 4+    Mite Mix Negative    Cat Negative    Dog Negative          Allergy  testing  results were read and interpreted by myself, documented by clinical staff.  Patient provided with copy of allergy  testing along with avoidance measures when indicated.   Hargis Springer, MD  [facility]        "

## 2024-10-02 ENCOUNTER — Encounter: Admitting: Family Medicine

## 2024-10-17 ENCOUNTER — Other Ambulatory Visit

## 2024-10-24 ENCOUNTER — Ambulatory Visit: Admitting: Endocrinology
